# Patient Record
Sex: Female | Born: 1940
Health system: Southern US, Community
[De-identification: ages and names within clinical notes are randomized; demographics above are authoritative.]

## PROBLEM LIST (undated history)

## (undated) DIAGNOSIS — K219 Gastro-esophageal reflux disease without esophagitis: Secondary | ICD-10-CM

## (undated) DIAGNOSIS — F329 Major depressive disorder, single episode, unspecified: Secondary | ICD-10-CM

## (undated) DIAGNOSIS — M1990 Unspecified osteoarthritis, unspecified site: Secondary | ICD-10-CM

## (undated) DIAGNOSIS — C4490 Unspecified malignant neoplasm of skin, unspecified: Secondary | ICD-10-CM

## (undated) DIAGNOSIS — J45909 Unspecified asthma, uncomplicated: Secondary | ICD-10-CM

## (undated) DIAGNOSIS — C50919 Malignant neoplasm of unspecified site of unspecified female breast: Secondary | ICD-10-CM

## (undated) DIAGNOSIS — Z923 Personal history of irradiation: Secondary | ICD-10-CM

## (undated) DIAGNOSIS — F32A Depression, unspecified: Secondary | ICD-10-CM

## (undated) DIAGNOSIS — M5432 Sciatica, left side: Secondary | ICD-10-CM

## (undated) DIAGNOSIS — T8859XA Other complications of anesthesia, initial encounter: Secondary | ICD-10-CM

## (undated) DIAGNOSIS — I10 Essential (primary) hypertension: Secondary | ICD-10-CM

## (undated) DIAGNOSIS — E039 Hypothyroidism, unspecified: Secondary | ICD-10-CM

## (undated) HISTORY — PX: KNEE ARTHROSCOPY: SUR90

## (undated) HISTORY — PX: THYROID SURGERY: SHX805

## (undated) HISTORY — PX: ABDOMINAL HYSTERECTOMY: SHX81

## (undated) HISTORY — PX: LIPOSUCTION: SHX10

## (undated) HISTORY — PX: ESOPHAGOGASTRODUODENOSCOPY: SHX1529

## (undated) HISTORY — PX: DG THUMB LEFT HAND: HXRAD1658

## (undated) HISTORY — PX: COLONOSCOPY W/ POLYPECTOMY: SHX1380

## (undated) HISTORY — PX: ABDOMINOPLASTY: SUR9

## (undated) HISTORY — PX: CHOLECYSTECTOMY: SHX55

## (undated) HISTORY — PX: DG THUMB RIGHT HAND (ARMC HX): HXRAD1825

## (undated) HISTORY — PX: ESOPHAGEAL DILATION: SHX303

---

## 1993-01-17 HISTORY — PX: MASTECTOMY: SHX3

## 1993-01-17 HISTORY — PX: BREAST LUMPECTOMY: SHX2

## 1993-06-17 DIAGNOSIS — C50919 Malignant neoplasm of unspecified site of unspecified female breast: Secondary | ICD-10-CM

## 1993-06-17 HISTORY — DX: Malignant neoplasm of unspecified site of unspecified female breast: C50.919

## 2003-11-24 ENCOUNTER — Ambulatory Visit: Payer: Self-pay | Admitting: Internal Medicine

## 2004-12-20 ENCOUNTER — Ambulatory Visit: Payer: Self-pay | Admitting: Internal Medicine

## 2005-01-18 ENCOUNTER — Ambulatory Visit: Payer: Self-pay | Admitting: Gastroenterology

## 2006-01-16 ENCOUNTER — Ambulatory Visit: Payer: Self-pay | Admitting: Internal Medicine

## 2007-04-09 ENCOUNTER — Ambulatory Visit: Payer: Self-pay | Admitting: Internal Medicine

## 2008-04-09 ENCOUNTER — Ambulatory Visit: Payer: Self-pay | Admitting: Internal Medicine

## 2008-07-09 ENCOUNTER — Ambulatory Visit: Payer: Self-pay | Admitting: Gastroenterology

## 2009-01-12 ENCOUNTER — Ambulatory Visit: Payer: Self-pay | Admitting: Unknown Physician Specialty

## 2009-02-04 ENCOUNTER — Ambulatory Visit: Payer: Self-pay | Admitting: Unknown Physician Specialty

## 2009-04-13 ENCOUNTER — Ambulatory Visit: Payer: Self-pay | Admitting: Internal Medicine

## 2010-04-28 ENCOUNTER — Ambulatory Visit: Payer: Self-pay | Admitting: Internal Medicine

## 2011-05-30 ENCOUNTER — Ambulatory Visit: Payer: Self-pay | Admitting: Family Medicine

## 2012-05-31 ENCOUNTER — Ambulatory Visit: Payer: Self-pay | Admitting: Family Medicine

## 2012-11-26 ENCOUNTER — Ambulatory Visit: Payer: Self-pay | Admitting: Family Medicine

## 2013-01-04 ENCOUNTER — Ambulatory Visit: Payer: Self-pay | Admitting: Family Medicine

## 2013-07-17 ENCOUNTER — Ambulatory Visit: Payer: Self-pay | Admitting: Family Medicine

## 2014-03-12 ENCOUNTER — Ambulatory Visit: Payer: Self-pay | Admitting: Gastroenterology

## 2015-03-24 ENCOUNTER — Other Ambulatory Visit: Payer: Self-pay | Admitting: Family Medicine

## 2015-03-24 DIAGNOSIS — Z1231 Encounter for screening mammogram for malignant neoplasm of breast: Secondary | ICD-10-CM

## 2015-04-01 ENCOUNTER — Ambulatory Visit
Admission: RE | Admit: 2015-04-01 | Discharge: 2015-04-01 | Disposition: A | Discharge status: Home / Self Care | Payer: Medicare Other | Source: Ambulatory Visit | Attending: Family Medicine | Admitting: Family Medicine

## 2015-04-01 DIAGNOSIS — Z1231 Encounter for screening mammogram for malignant neoplasm of breast: Secondary | ICD-10-CM | POA: Insufficient documentation

## 2015-04-01 HISTORY — DX: Malignant neoplasm of unspecified site of unspecified female breast: C50.919

## 2015-04-01 HISTORY — DX: Unspecified malignant neoplasm of skin, unspecified: C44.90

## 2015-04-06 ENCOUNTER — Other Ambulatory Visit: Payer: Self-pay | Admitting: Family Medicine

## 2015-04-06 DIAGNOSIS — N63 Unspecified lump in unspecified breast: Secondary | ICD-10-CM

## 2015-04-07 ENCOUNTER — Ambulatory Visit
Admission: RE | Admit: 2015-04-07 | Discharge: 2015-04-07 | Disposition: A | Discharge status: Home / Self Care | Payer: Medicare Other | Source: Ambulatory Visit | Attending: Family Medicine | Admitting: Family Medicine

## 2015-04-07 DIAGNOSIS — N63 Unspecified lump in unspecified breast: Secondary | ICD-10-CM

## 2015-04-07 DIAGNOSIS — Z853 Personal history of malignant neoplasm of breast: Secondary | ICD-10-CM | POA: Diagnosis not present

## 2015-04-08 ENCOUNTER — Other Ambulatory Visit: Payer: Self-pay | Admitting: Family Medicine

## 2015-04-08 DIAGNOSIS — N6320 Unspecified lump in the left breast, unspecified quadrant: Secondary | ICD-10-CM

## 2015-04-13 ENCOUNTER — Ambulatory Visit
Admission: RE | Admit: 2015-04-13 | Discharge: 2015-04-13 | Disposition: A | Discharge status: Home / Self Care | Payer: Medicare Other | Source: Ambulatory Visit | Attending: Family Medicine | Admitting: Family Medicine

## 2015-04-13 DIAGNOSIS — N6320 Unspecified lump in the left breast, unspecified quadrant: Secondary | ICD-10-CM

## 2015-04-13 DIAGNOSIS — N6489 Other specified disorders of breast: Secondary | ICD-10-CM | POA: Insufficient documentation

## 2015-04-13 DIAGNOSIS — Z9889 Other specified postprocedural states: Secondary | ICD-10-CM | POA: Diagnosis not present

## 2015-04-13 DIAGNOSIS — N63 Unspecified lump in breast: Secondary | ICD-10-CM | POA: Diagnosis present

## 2015-04-13 HISTORY — PX: BREAST BIOPSY: SHX20

## 2015-04-15 LAB — SURGICAL PATHOLOGY

## 2015-04-28 ENCOUNTER — Other Ambulatory Visit: Payer: Self-pay | Admitting: Surgery

## 2015-04-28 DIAGNOSIS — N63 Unspecified lump in unspecified breast: Secondary | ICD-10-CM

## 2015-04-30 ENCOUNTER — Encounter
Admission: RE | Admit: 2015-04-30 | Discharge: 2015-04-30 | Disposition: A | Discharge status: Home / Self Care | Payer: Medicare Other | Source: Ambulatory Visit | Attending: Surgery | Admitting: Surgery

## 2015-04-30 DIAGNOSIS — Z01810 Encounter for preprocedural cardiovascular examination: Secondary | ICD-10-CM | POA: Diagnosis not present

## 2015-04-30 DIAGNOSIS — I10 Essential (primary) hypertension: Secondary | ICD-10-CM

## 2015-04-30 HISTORY — DX: Major depressive disorder, single episode, unspecified: F32.9

## 2015-04-30 HISTORY — DX: Gastro-esophageal reflux disease without esophagitis: K21.9

## 2015-04-30 HISTORY — DX: Essential (primary) hypertension: I10

## 2015-04-30 HISTORY — DX: Unspecified asthma, uncomplicated: J45.909

## 2015-04-30 HISTORY — DX: Hypothyroidism, unspecified: E03.9

## 2015-04-30 HISTORY — DX: Unspecified osteoarthritis, unspecified site: M19.90

## 2015-04-30 HISTORY — DX: Depression, unspecified: F32.A

## 2015-04-30 NOTE — Pre-Procedure Instructions (Signed)
EKG reveals no change from EKG taken in 2011.  No cardiac symptoms present.

## 2015-04-30 NOTE — Patient Instructions (Signed)
  Your procedure is scheduled on: 05/05/15 Report to Day Surgery. Arrive at [facility] at 10:00.  Remember: Instructions that are not followed completely may result in serious medical risk, up to and including death, or upon the discretion of your surgeon and anesthesiologist your surgery may need to be rescheduled.    __x__ 1. Do not eat food or drink liquids after midnight. No gum chewing or hard candies.     __x__ 2. No Alcohol for 24 hours before or after surgery.   ____ 3. Bring all medications with you on the day of surgery if instructed.    __x__ 4. Notify your doctor if there is any change in your medical condition     (cold, fever, infections).     Do not wear jewelry, make-up, hairpins, clips or nail polish.  Do not wear lotions, powders, or perfumes. You may wear deodorant.  Do not shave 48 hours prior to surgery. Men may shave face and neck.  Do not bring valuables to the hospital.    [facility] is not responsible for any belongings or valuables.               Contacts, dentures or bridgework may not be worn into surgery.  Leave your suitcase in the car. After surgery it may be brought to your room.  For patients admitted to the hospital, discharge time is determined by your                treatment team.   Patients discharged the day of surgery will not be allowed to drive home.   Please read over the following fact sheets that you were given:   Surgical Site Infection Prevention   ____ Take these medicines the morning of surgery with A SIP OF WATER:    1. Omeprazole the night before surgery and morning of surgery  2. levothroxine  3. claritin  4. effexor  5.  6.  ____ Fleet Enema (as directed)   __x__ Use CHG Soap as directed  __x__ Use inhalers on the day of surgery and bring it with you  ____ Stop metformin 2 days prior to surgery    ____ Take 1/2 of usual insulin dose the night before surgery and none on the morning of surgery.   ____ Stop  Coumadin/Plavix/aspirin   _x___ Stop Anti-inflammatories on Saturday per MD instruction   _x___ Stop supplements until after surgery.  Glucosamine today  ____ Bring C-Pap to the hospital.

## 2015-05-05 ENCOUNTER — Ambulatory Visit: Payer: Medicare Other | Admitting: Anesthesiology

## 2015-05-05 ENCOUNTER — Encounter: Payer: Self-pay | Admitting: *Deleted

## 2015-05-05 ENCOUNTER — Ambulatory Visit
Admission: RE | Admit: 2015-05-05 | Discharge: 2015-05-05 | Disposition: A | Discharge status: Home / Self Care | Payer: Medicare Other | Source: Ambulatory Visit | Attending: Surgery | Admitting: Surgery

## 2015-05-05 ENCOUNTER — Encounter
Admission: RE | Disposition: A | Discharge status: Home / Self Care | Payer: Self-pay | Source: Ambulatory Visit | Attending: Surgery

## 2015-05-05 DIAGNOSIS — Z823 Family history of stroke: Secondary | ICD-10-CM | POA: Diagnosis not present

## 2015-05-05 DIAGNOSIS — Z825 Family history of asthma and other chronic lower respiratory diseases: Secondary | ICD-10-CM | POA: Diagnosis not present

## 2015-05-05 DIAGNOSIS — G43909 Migraine, unspecified, not intractable, without status migrainosus: Secondary | ICD-10-CM | POA: Diagnosis not present

## 2015-05-05 DIAGNOSIS — Z9049 Acquired absence of other specified parts of digestive tract: Secondary | ICD-10-CM | POA: Insufficient documentation

## 2015-05-05 DIAGNOSIS — K5790 Diverticulosis of intestine, part unspecified, without perforation or abscess without bleeding: Secondary | ICD-10-CM | POA: Diagnosis not present

## 2015-05-05 DIAGNOSIS — Z8249 Family history of ischemic heart disease and other diseases of the circulatory system: Secondary | ICD-10-CM | POA: Diagnosis not present

## 2015-05-05 DIAGNOSIS — D242 Benign neoplasm of left breast: Secondary | ICD-10-CM | POA: Insufficient documentation

## 2015-05-05 DIAGNOSIS — Z79899 Other long term (current) drug therapy: Secondary | ICD-10-CM | POA: Diagnosis not present

## 2015-05-05 DIAGNOSIS — Z90710 Acquired absence of both cervix and uterus: Secondary | ICD-10-CM | POA: Insufficient documentation

## 2015-05-05 DIAGNOSIS — F329 Major depressive disorder, single episode, unspecified: Secondary | ICD-10-CM | POA: Insufficient documentation

## 2015-05-05 DIAGNOSIS — N63 Unspecified lump in unspecified breast: Secondary | ICD-10-CM

## 2015-05-05 DIAGNOSIS — F410 Panic disorder [episodic paroxysmal anxiety] without agoraphobia: Secondary | ICD-10-CM | POA: Insufficient documentation

## 2015-05-05 DIAGNOSIS — Z881 Allergy status to other antibiotic agents status: Secondary | ICD-10-CM | POA: Insufficient documentation

## 2015-05-05 DIAGNOSIS — N6092 Unspecified benign mammary dysplasia of left breast: Secondary | ICD-10-CM | POA: Insufficient documentation

## 2015-05-05 DIAGNOSIS — Z7951 Long term (current) use of inhaled steroids: Secondary | ICD-10-CM | POA: Diagnosis not present

## 2015-05-05 DIAGNOSIS — K219 Gastro-esophageal reflux disease without esophagitis: Secondary | ICD-10-CM | POA: Insufficient documentation

## 2015-05-05 DIAGNOSIS — M8580 Other specified disorders of bone density and structure, unspecified site: Secondary | ICD-10-CM | POA: Diagnosis not present

## 2015-05-05 DIAGNOSIS — Z86010 Personal history of colonic polyps: Secondary | ICD-10-CM | POA: Insufficient documentation

## 2015-05-05 DIAGNOSIS — Z880 Allergy status to penicillin: Secondary | ICD-10-CM | POA: Diagnosis not present

## 2015-05-05 DIAGNOSIS — Z800 Family history of malignant neoplasm of digestive organs: Secondary | ICD-10-CM | POA: Diagnosis not present

## 2015-05-05 DIAGNOSIS — Z888 Allergy status to other drugs, medicaments and biological substances status: Secondary | ICD-10-CM | POA: Insufficient documentation

## 2015-05-05 DIAGNOSIS — E559 Vitamin D deficiency, unspecified: Secondary | ICD-10-CM | POA: Insufficient documentation

## 2015-05-05 DIAGNOSIS — Z853 Personal history of malignant neoplasm of breast: Secondary | ICD-10-CM | POA: Diagnosis not present

## 2015-05-05 DIAGNOSIS — K5900 Constipation, unspecified: Secondary | ICD-10-CM | POA: Diagnosis not present

## 2015-05-05 DIAGNOSIS — I10 Essential (primary) hypertension: Secondary | ICD-10-CM | POA: Diagnosis not present

## 2015-05-05 DIAGNOSIS — E039 Hypothyroidism, unspecified: Secondary | ICD-10-CM | POA: Diagnosis not present

## 2015-05-05 HISTORY — PX: BREAST BIOPSY: SHX20

## 2015-05-05 HISTORY — PX: BREAST LUMPECTOMY WITH NEEDLE LOCALIZATION: SHX5759

## 2015-05-05 SURGERY — BREAST LUMPECTOMY WITH NEEDLE LOCALIZATION
Anesthesia: General | Laterality: Left | Wound class: Clean

## 2015-05-05 MED ORDER — FENTANYL CITRATE (PF) 100 MCG/2ML IJ SOLN
INTRAMUSCULAR | Status: DC | PRN
  Administered 2015-05-05 (×2): 50 ug via INTRAVENOUS

## 2015-05-05 MED ORDER — SCOPOLAMINE 1 MG/3DAYS TD PT72
MEDICATED_PATCH | TRANSDERMAL | Status: DC
Start: 2015-05-05 — End: 2015-05-06
  Filled 2015-05-05: qty 1

## 2015-05-05 MED ORDER — BUPIVACAINE-EPINEPHRINE (PF) 0.5% -1:200000 IJ SOLN
INTRAMUSCULAR | Status: AC
Start: 2015-05-05 — End: 2015-05-05
  Filled 2015-05-05: qty 30

## 2015-05-05 MED ORDER — LACTATED RINGERS IV SOLN
INTRAVENOUS | Status: DC
  Administered 2015-05-05 (×2): via INTRAVENOUS

## 2015-05-05 MED ORDER — ONDANSETRON HCL 4 MG/2ML IJ SOLN: 4.0000 mg | Freq: Once | INTRAMUSCULAR | Status: DC | PRN

## 2015-05-05 MED ORDER — ROCURONIUM BROMIDE 100 MG/10ML IV SOLN
INTRAVENOUS | Status: DC | PRN
  Administered 2015-05-05: 40 mg via INTRAVENOUS

## 2015-05-05 MED ORDER — HYDROCODONE-ACETAMINOPHEN 5-325 MG PO TABS: 1.0000 | ORAL_TABLET | ORAL | Status: DC | PRN

## 2015-05-05 MED ORDER — SUGAMMADEX SODIUM 200 MG/2ML IV SOLN
INTRAVENOUS | Status: DC | PRN
  Administered 2015-05-05: 153.4 mg via INTRAVENOUS

## 2015-05-05 MED ORDER — MIDAZOLAM HCL 2 MG/2ML IJ SOLN
INTRAMUSCULAR | Status: DC | PRN
  Administered 2015-05-05 (×2): 1 mg via INTRAVENOUS

## 2015-05-05 MED ORDER — FENTANYL CITRATE (PF) 100 MCG/2ML IJ SOLN: 25.0000 ug | INTRAMUSCULAR | Status: DC | PRN

## 2015-05-05 MED ORDER — PROPOFOL 10 MG/ML IV BOLUS
INTRAVENOUS | Status: DC | PRN
Start: 2015-05-05 — End: 2015-05-05
  Administered 2015-05-05: 150 mg via INTRAVENOUS

## 2015-05-05 MED ORDER — BUPIVACAINE-EPINEPHRINE (PF) 0.5% -1:200000 IJ SOLN
INTRAMUSCULAR | Status: DC | PRN
  Administered 2015-05-05: 10 mL

## 2015-05-05 MED ORDER — ONDANSETRON HCL 4 MG/2ML IJ SOLN
INTRAMUSCULAR | Status: DC | PRN
  Administered 2015-05-05: 4 mg via INTRAVENOUS

## 2015-05-05 MED ORDER — LIDOCAINE HCL (CARDIAC) 20 MG/ML IV SOLN
INTRAVENOUS | Status: DC | PRN
Start: 2015-05-05 — End: 2015-05-05
  Administered 2015-05-05: 30 mg via INTRAVENOUS

## 2015-05-05 MED ORDER — SCOPOLAMINE 1 MG/3DAYS TD PT72
1.0000 | MEDICATED_PATCH | Freq: Once | TRANSDERMAL | Status: DC
  Administered 2015-05-05: 1.5 mg via TRANSDERMAL

## 2015-05-05 SURGICAL SUPPLY — 29 items
BLADE SURG 15 STRL LF DISP TIS (BLADE) ×1 IMPLANT
BLADE SURG 15 STRL SS (BLADE) ×2
CANISTER SUCT 1200ML W/VALVE (MISCELLANEOUS) ×3 IMPLANT
CHLORAPREP W/TINT 26ML (MISCELLANEOUS) ×3 IMPLANT
CNTNR SPEC 2.5X3XGRAD LEK (MISCELLANEOUS)
CONT SPEC 4OZ STER OR WHT (MISCELLANEOUS)
CONTAINER SPEC 2.5X3XGRAD LEK (MISCELLANEOUS) IMPLANT
DEVICE DUBIN SPECIMEN MAMMOGRA (MISCELLANEOUS) ×3 IMPLANT
DRAPE LAPAROTOMY TRNSV 106X77 (MISCELLANEOUS) ×3 IMPLANT
ELECT REM PT RETURN 9FT ADLT (ELECTROSURGICAL) ×3
ELECTRODE REM PT RTRN 9FT ADLT (ELECTROSURGICAL) ×1 IMPLANT
GLOVE BIO SURGEON STRL SZ7.5 (GLOVE) ×3 IMPLANT
GOWN STRL REUS W/ TWL LRG LVL3 (GOWN DISPOSABLE) ×4 IMPLANT
GOWN STRL REUS W/TWL LRG LVL3 (GOWN DISPOSABLE) ×8
KIT RM TURNOVER STRD PROC AR (KITS) ×3 IMPLANT
LABEL OR SOLS (LABEL) ×3 IMPLANT
LIQUID BAND (GAUZE/BANDAGES/DRESSINGS) ×3 IMPLANT
MARGIN MAP 10MM (MISCELLANEOUS) ×3 IMPLANT
NEEDLE HYPO 25X1 1.5 SAFETY (NEEDLE) ×3 IMPLANT
PACK BASIN MINOR ARMC (MISCELLANEOUS) ×3 IMPLANT
SLEVE PROBE SENORX GAMMA FIND (MISCELLANEOUS) IMPLANT
SPONGE LAP 18X18 5 PK (GAUZE/BANDAGES/DRESSINGS) ×3 IMPLANT
SUT CHROMIC 4 0 RB 1X27 (SUTURE) ×3 IMPLANT
SUT CHROMIC 4 0 SH 27 (SUTURE) ×3 IMPLANT
SUT ETHILON 3-0 FS-10 30 BLK (SUTURE) ×3
SUT MNCRL AB 4-0 PS2 18 (SUTURE) IMPLANT
SUTURE EHLN 3-0 FS-10 30 BLK (SUTURE) ×1 IMPLANT
SYRINGE 10CC LL (SYRINGE) ×3 IMPLANT
WATER STERILE IRR 1000ML POUR (IV SOLUTION) ×3 IMPLANT

## 2015-05-05 NOTE — H&P (Signed)
  She reports no change in condition since the day of the office exam. I discussed the plan for excision of left breast mass. X-rays have been reviewed. The left side was marked YES

## 2015-05-05 NOTE — Discharge Instructions (Addendum)
Take Tylenol or Norco if needed for pain.   May shower.   Wear bra as desired for support and comfort.      AMBULATORY SURGERY  DISCHARGE INSTRUCTIONS   1) The drugs that you were given will stay in your system until tomorrow so for the next 24 hours you should not:  A) Drive an automobile B) Make any legal decisions C) Drink any alcoholic beverage   2) You may resume regular meals tomorrow.  Today it is better to start with liquids and gradually work up to solid foods.  You may eat anything you prefer, but it is better to start with liquids, then soup and crackers, and gradually work up to solid foods.   3) Please notify your doctor immediately if you have any unusual bleeding, trouble breathing, redness and pain at the surgery site, drainage, fever, or pain not relieved by medication.    4) Additional Instructions:        Please contact your physician with any problems or Same Day Surgery at [phone], Monday through Friday 6 am to 4 pm, or [place] [facility] number at [phone].

## 2015-05-05 NOTE — Op Note (Signed)
OPERATIVE REPORT  PREOPERATIVE  DIAGNOSIS: . Left breast mass  POSTOPERATIVE DIAGNOSIS: . Left breast mass  PROCEDURE: . Excision left breast mass  ANESTHESIA:  General  SURGEON: Rochel Brome  MD   INDICATIONS: . She had recent mammogram depicting a small 7 mm nodule in the upper outer quadrant of the left breast. This was also demonstrated on ultrasound. Ultrasound-guided needle biopsy demonstrated intraductal papilloma with atypia. 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 Excision of this mass was recommended for further treatment and evaluation.  With the patient on the operating table in the supine position under general anesthesia the dressing was removed from the left breast exposing the Kopan's wire which entered the breast at the 1:30 position some 8 cm from the nipple. The wire was cut 2 cm from the skin. The left arm was placed on a lateral arm support. The breast was prepared with ChloraPrep and draped in a sterile manner.  An obliquely oriented curvilinear incision was made from approximately 1:00 position to the 2:00 position carried down through subcutaneous tissues and encountered the Kopan's wire. The biopsy site was identified. A sample of tissue approximately 2 cm in diameter was excised. Specimen mammogram demonstrated the presence of the biopsy marker. The specimen was submitted for routine pathology  The wound was inspected and could see hemostasis was intact. The subcutaneous tissues were infiltrated with half percent Sensorcaine with epinephrine. The wound was closed with a running 4-0 Monocryl subcuticular suture and LiquiBand.  The patient tolerated the procedure satisfactorily and appeared to be in satisfactory condition for transfer to the recovery room  Ravanna.D.

## 2015-05-05 NOTE — Anesthesia Procedure Notes (Signed)
Procedure Name: Intubation Date/Time: 05/05/2015 2:20 PM Performed by: Courtney Paris Pre-anesthesia Checklist: Patient identified, Patient being monitored, Timeout performed, Emergency Drugs available and Suction available Patient Re-evaluated:Patient Re-evaluated prior to inductionOxygen Delivery Method: Circle system utilized Preoxygenation: Pre-oxygenation with 100% oxygen Intubation Type: IV induction Ventilation: Mask ventilation without difficulty Laryngoscope Size: 3 and Miller Grade View: Grade I Tube type: Oral Tube size: 7.0 mm Number of attempts: 1 Airway Equipment and Method: Stylet Placement Confirmation: ETT inserted through vocal cords under direct vision,  positive ETCO2 and breath sounds checked- equal and bilateral Secured at: 22 cm Tube secured with: Tape Dental Injury: Teeth and Oropharynx as per pre-operative assessment

## 2015-05-05 NOTE — Transfer of Care (Signed)
Immediate Anesthesia Transfer of Care Note  Patient: Kristine Woods  Procedure(s) Performed: Procedure(s): BREAST LUMPECTOMY WITH NEEDLE LOCALIZATION (Left)  Patient Location: PACU  Anesthesia Type:General  Level of Consciousness: patient cooperative and lethargic  Airway & Oxygen Therapy: Patient Spontanous Breathing and Patient connected to face mask oxygen  Post-op Assessment: Report given to RN and Post -op Vital signs reviewed and stable  Post vital signs: Reviewed and stable  Last Vitals:  Filed Vitals:   05/05/15 1100 05/05/15 1526  BP: 134/93 144/89  Pulse: 93 109  Temp: 36.1 C 36.3 C  Resp: 16 15    Complications: No apparent anesthesia complications

## 2015-05-05 NOTE — Anesthesia Preprocedure Evaluation (Signed)
Anesthesia Evaluation  Patient identified by MRN, date of birth, ID band Patient awake    Reviewed: Allergy & Precautions, H&P , NPO status , Patient's Chart, lab work & pertinent test results, reviewed documented beta blocker date and time   History of Anesthesia Complications (+) PONV and history of anesthetic complications  Airway Mallampati: II  TM Distance: >3 FB Neck ROM: full    Dental  (+) Teeth Intact Permanent bridge:   Pulmonary neg shortness of breath, asthma , neg sleep apnea, neg COPD, neg recent URI,    Pulmonary exam normal breath sounds clear to auscultation       Cardiovascular Exercise Tolerance: Good hypertension, (-) angina(-) CAD, (-) Past MI, (-) Cardiac Stents and (-) CABG Normal cardiovascular exam(-) dysrhythmias (-) Valvular Problems/Murmurs Rhythm:regular Rate:Normal     Neuro/Psych PSYCHIATRIC DISORDERS (Depression) negative neurological ROS     GI/Hepatic Neg liver ROS, GERD  Medicated,  Endo/Other  neg diabetesHypothyroidism   Renal/GU negative Renal ROS  negative genitourinary   Musculoskeletal   Abdominal   Peds  Hematology negative hematology ROS (+)   Anesthesia Other Findings Past Medical History:   Breast cancer ([place])                             06/17/93         Comment:lt lumpectomy/rad   Skin cancer                                                  Hypothyroidism                                               Hypertension                                                 Asthma                                                       Depression                                                   GERD (gastroesophageal reflux disease)                       Arthritis                                                    Reproductive/Obstetrics negative OB ROS  Anesthesia Physical Anesthesia Plan  ASA: III  Anesthesia Plan: General    Post-op Pain Management:    Induction:   Airway Management Planned:   Additional Equipment:   Intra-op Plan:   Post-operative Plan:   Informed Consent: I have reviewed the patients History and Physical, chart, labs and discussed the procedure including the risks, benefits and alternatives for the proposed anesthesia with the patient or authorized representative who has indicated his/her understanding and acceptance.   Dental Advisory Given  Plan Discussed with: Anesthesiologist, CRNA and Surgeon  Anesthesia Plan Comments:         Anesthesia Quick Evaluation

## 2015-05-06 ENCOUNTER — Encounter: Payer: Self-pay | Admitting: Surgery

## 2015-05-06 LAB — SURGICAL PATHOLOGY

## 2015-05-06 NOTE — Anesthesia Postprocedure Evaluation (Signed)
Anesthesia Post Note  Patient: Kristine Woods  Procedure(s) Performed: Procedure(s) (LRB): BREAST LUMPECTOMY WITH NEEDLE LOCALIZATION (Left)  Patient location during evaluation: PACU Anesthesia Type: General Level of consciousness: awake and alert Pain management: pain level controlled Vital Signs Assessment: post-procedure vital signs reviewed and stable Respiratory status: spontaneous breathing, nonlabored ventilation, respiratory function stable and patient connected to nasal cannula oxygen Cardiovascular status: blood pressure returned to baseline and stable Postop Assessment: no signs of nausea or vomiting Anesthetic complications: no    Last Vitals:  Filed Vitals:   05/05/15 1623 05/05/15 1740  BP: 135/74 136/74  Pulse: 94 96  Temp: 37 C 36.8 C  Resp: 14 16    Last Pain:  Filed Vitals:   05/05/15 1745  PainSc: 0-No pain                 Martha Clan

## 2015-05-07 ENCOUNTER — Encounter: Payer: Self-pay | Admitting: Surgery

## 2015-10-28 ENCOUNTER — Ambulatory Visit (INDEPENDENT_AMBULATORY_CARE_PROVIDER_SITE_OTHER): Payer: Medicare Other

## 2015-10-28 ENCOUNTER — Ambulatory Visit
Admission: EM | Admit: 2015-10-28 | Discharge: 2015-10-28 | Disposition: A | Discharge status: Home / Self Care | Payer: Medicare Other | Attending: Family Medicine | Admitting: Family Medicine

## 2015-10-28 DIAGNOSIS — M79605 Pain in left leg: Secondary | ICD-10-CM

## 2015-10-28 DIAGNOSIS — M5432 Sciatica, left side: Secondary | ICD-10-CM

## 2015-10-28 MED ORDER — PREDNISONE 20 MG PO TABS: 20.0000 mg | ORAL_TABLET | Freq: Every day | ORAL | 0 refills | Status: DC

## 2015-10-28 MED ORDER — OXYCODONE-ACETAMINOPHEN 5-325 MG PO TABS: 1.0000 | ORAL_TABLET | Freq: Three times a day (TID) | ORAL | 0 refills | Status: DC | PRN

## 2015-10-28 NOTE — ED Triage Notes (Signed)
Patient c/o left leg pain since Monday. She says she has a bad knee on that side and she thought that may be the problem. She rested it yesterday however today there is pain that is radiating into her buttocks.

## 2015-10-28 NOTE — ED Provider Notes (Signed)
[facility]    CSN: [id number] Arrival date & time: 10/28/15  1047     History   Chief Complaint Chief Complaint  Patient presents with  . Leg Pain    Left    HPI Kristine Woods is a 75 y.o. female.   The history is provided by the patient.  Leg Pain  Location:  Leg Injury: no   Leg location:  L lower leg and L leg Pain details:    Quality:  Aching   Radiates to:  Does not radiate   Severity:  Severe   Onset quality:  Sudden   Duration:  3 days   Timing:  Constant   Progression:  Unchanged Chronicity:  New Dislocation: no   Foreign body present:  No foreign bodies Prior injury to area:  No Relieved by: pain medication. Associated symptoms: back pain (also started with left sided low back and buttock pain yesterday)   Associated symptoms: no fever     Past Medical History:  Diagnosis Date  . Arthritis   . Asthma   . Breast cancer ([place]) 06/17/93   lt lumpectomy/rad  . Depression   . GERD (gastroesophageal reflux disease)   . Hypertension   . Hypothyroidism   . Skin cancer     There are no active problems to display for this patient.   Past Surgical History:  Procedure Laterality Date  . ABDOMINAL HYSTERECTOMY    . ABDOMINOPLASTY    . BREAST BIOPSY Left 04/13/2015   intraductal papilloma  . BREAST LUMPECTOMY WITH NEEDLE LOCALIZATION Left 05/05/2015   Procedure: BREAST LUMPECTOMY WITH NEEDLE LOCALIZATION;  Surgeon: Leonie Green, MD;  Location: [facility] ORS;  Service: General;  Laterality: Left;  . CHOLECYSTECTOMY    . COLONOSCOPY W/ POLYPECTOMY    . DG THUMB LEFT HAND    . DG THUMB RIGHT HAND ([place] HX)    . ESOPHAGEAL DILATION    . ESOPHAGOGASTRODUODENOSCOPY    . KNEE ARTHROSCOPY Left   . LIPOSUCTION    . MASTECTOMY Left    partial   . THYROID SURGERY     adenoma    OB History    No data available       Home Medications    Prior to Admission medications   Medication Sig Start Date End Date Taking? Authorizing Provider    albuterol (PROVENTIL HFA;VENTOLIN HFA) 108 (90 Base) MCG/ACT inhaler Inhale 2 puffs into the lungs every 4 (four) hours as needed for wheezing or shortness of breath.   Yes Historical Provider, MD  calcium-vitamin D (OSCAL WITH D) 500-200 MG-UNIT tablet Take 2 tablets by mouth daily with breakfast. Calcium carbonate 600 Vitamin D3 400   Yes Historical Provider, MD  enalapril-hydrochlorothiazide (VASERETIC) 10-25 MG tablet Take 1 tablet by mouth every morning.   Yes Historical Provider, MD  FIBER PO Take 2 tablets by mouth daily.   Yes Historical Provider, MD  Glucosamine Sulfate 1000 MG CAPS Take 1 capsule by mouth every morning.   Yes Historical Provider, MD  ibuprofen (ADVIL,MOTRIN) 200 MG tablet Take 400 mg by mouth 2 (two) times daily.   Yes Historical Provider, MD  levothyroxine (SYNTHROID, LEVOTHROID) 75 MCG tablet Take 75 mcg by mouth daily before breakfast.   Yes Historical Provider, MD  loratadine (CLARITIN) 10 MG tablet Take 10 mg by mouth daily.   Yes Historical Provider, MD  Multiple Vitamin (MULTIVITAMIN) tablet Take 1 tablet by mouth daily.   Yes Historical Provider, MD  omeprazole ([place])  20 MG capsule Take 20 mg by mouth daily.   Yes Historical Provider, MD  venlafaxine (EFFEXOR) 75 MG tablet Take 75 mg by mouth 2 (two) times daily.   Yes Historical Provider, MD  HYDROcodone-acetaminophen (NORCO) 5-325 MG tablet Take 1-2 tablets by mouth every 4 (four) hours as needed for moderate pain. 05/05/15   Leonie Green, MD  oxyCODONE-acetaminophen (PERCOCET/ROXICET) 5-325 MG tablet Take 1-2 tablets by mouth every 8 (eight) hours as needed for severe pain. 10/28/15   Norval Gable, MD  predniSONE (DELTASONE) 20 MG tablet Take 1 tablet (20 mg total) by mouth daily with breakfast. 10/28/15   Norval Gable, MD    Family History History reviewed. No pertinent family history.  Social History Social History  Substance Use Topics  . Smoking status: Never Smoker  . Smokeless tobacco:  Never Used  . Alcohol use Yes     Comment: 2 glasses a week     Allergies   Erythromycin; Other; Penicillins; and Sulfa antibiotics   Review of Systems Review of Systems  Constitutional: Negative for fever.  Musculoskeletal: Positive for back pain (also started with left sided low back and buttock pain yesterday).     Physical Exam Triage Vital Signs ED Triage Vitals  Enc Vitals Group     BP 10/28/15 1104 (!) 143/87     Pulse Rate 10/28/15 1104 93     Resp 10/28/15 1104 18     Temp 10/28/15 1104 97.2 F (36.2 C)     Temp Source 10/28/15 1104 Tympanic     SpO2 10/28/15 1104 95 %     Weight 10/28/15 1103 176 lb (79.8 kg)     Height 10/28/15 1103 5\' 3"  (1.6 m)     Head Circumference --      Peak Flow --      Pain Score --      Pain Loc --      Pain Edu? --      Excl. in [place]? --    No data found.   Updated Vital Signs BP (!) 143/87 (BP Location: Right Arm)   Pulse 93   Temp 97.2 F (36.2 C) (Tympanic)   Resp 18   Ht 5\' 3"  (1.6 m)   Wt 176 lb (79.8 kg)   SpO2 95%   BMI 31.18 kg/m   Visual Acuity Right Eye Distance:   Left Eye Distance:   Bilateral Distance:    Right Eye Near:   Left Eye Near:    Bilateral Near:     Physical Exam  Constitutional: She is oriented to person, place, and time. She appears well-developed and well-nourished. No distress.  Musculoskeletal:       Lumbar back: She exhibits tenderness (over the lumbar paraspinous muscles on the left and over the left buttock). She exhibits normal range of motion, no bony tenderness, no swelling, no edema, no deformity, no laceration, no pain, no spasm and normal pulse.       Left lower leg: She exhibits bony tenderness (over tibia) and swelling (mild, over tibia). She exhibits no tenderness, no edema, no deformity and no laceration.  Neurological: She is alert and oriented to person, place, and time. She displays normal reflexes. No cranial nerve deficit. She exhibits normal muscle tone. Coordination  normal.  Skin: She is not diaphoretic. No erythema.  Nursing note and vitals reviewed.    UC Treatments / Results  Labs (all labs ordered are listed, but only abnormal results are displayed) Labs Reviewed -  No data to display  EKG  EKG Interpretation None       Radiology No results found.  Procedures Procedures (including critical care time)  Medications Ordered in UC Medications - No data to display   Initial Impression / Assessment and Plan / UC Course  I have reviewed the triage vital signs and the nursing notes.  Pertinent labs & imaging results that were available during my care of the patient were reviewed by me and considered in my medical decision making (see chart for details).  Clinical Course      Final Clinical Impressions(s) / UC Diagnoses   Final diagnoses:  Left leg pain  Sciatica of left side    New Prescriptions New Prescriptions   OXYCODONE-ACETAMINOPHEN (PERCOCET/ROXICET) 5-325 MG TABLET    Take 1-2 tablets by mouth every 8 (eight) hours as needed for severe pain.   PREDNISONE (DELTASONE) 20 MG TABLET    Take 1 tablet (20 mg total) by mouth daily with breakfast.   1. X-ray results and diagnoses reviewed with patient 2. rx as per orders above; reviewed possible side effects, interactions, risks and benefits  3. Recommend supportive treatment with rest, ice, stretching 4. Follow-up prn if symptoms worsen or don't improve   Norval Gable, MD 10/28/15 1215

## 2015-11-01 ENCOUNTER — Ambulatory Visit
Admission: RE | Admit: 2015-11-01 | Discharge: 2015-11-01 | Disposition: A | Discharge status: Home / Self Care | Payer: Medicare Other | Source: Ambulatory Visit | Attending: Family Medicine | Admitting: Family Medicine

## 2015-11-01 ENCOUNTER — Encounter: Payer: Self-pay | Admitting: *Deleted

## 2015-11-01 ENCOUNTER — Ambulatory Visit
Admission: AD | Admit: 2015-11-01 | Discharge: 2015-11-01 | Disposition: A | Discharge status: Home / Self Care | Payer: Medicare Other | Source: Ambulatory Visit | Attending: Family Medicine | Admitting: Family Medicine

## 2015-11-01 ENCOUNTER — Ambulatory Visit
Admission: EM | Admit: 2015-11-01 | Discharge: 2015-11-01 | Disposition: A | Discharge status: Home / Self Care | Payer: Medicare Other | Attending: Family Medicine | Admitting: Family Medicine

## 2015-11-01 DIAGNOSIS — M5432 Sciatica, left side: Secondary | ICD-10-CM

## 2015-11-01 DIAGNOSIS — M79605 Pain in left leg: Secondary | ICD-10-CM

## 2015-11-01 DIAGNOSIS — R938 Abnormal findings on diagnostic imaging of other specified body structures: Secondary | ICD-10-CM | POA: Insufficient documentation

## 2015-11-01 MED ORDER — OXYCODONE-ACETAMINOPHEN 5-325 MG PO TABS: 1.0000 | ORAL_TABLET | Freq: Three times a day (TID) | ORAL | 0 refills | Status: DC | PRN

## 2015-11-01 NOTE — ED Provider Notes (Signed)
[facility]    CSN: [id number] Arrival date & time: 11/01/15  0818     History   Chief Complaint Chief Complaint  Patient presents with  . Back Pain  . Leg Pain  . Foot Pain  . Ankle Pain    HPI Kristine Woods is a 75 y.o. female.   75 yo female seen here Wednesday for sciatica, here today with c/o continued low back pain along with left lower leg pain, ankle and foot pain. Denies injury. States left big toe feels numb. Denies any saddle anesthesia, fevers, chills, rash, bowel or urinary problems. Patient also complains of left calf pain. Denies recent travel, prolonged immobilization or recent surgery.         The history is provided by the patient.  Back Pain  Associated symptoms: leg pain   Leg Pain  Associated symptoms: back pain   Foot Pain   Ankle Pain  Associated symptoms: back pain     Past Medical History:  Diagnosis Date  . Arthritis   . Asthma   . Breast cancer ([place]) 06/17/93   lt lumpectomy/rad  . Depression   . GERD (gastroesophageal reflux disease)   . Hypertension   . Hypothyroidism   . Skin cancer     There are no active problems to display for this patient.   Past Surgical History:  Procedure Laterality Date  . ABDOMINAL HYSTERECTOMY    . ABDOMINOPLASTY    . BREAST BIOPSY Left 04/13/2015   intraductal papilloma  . BREAST LUMPECTOMY WITH NEEDLE LOCALIZATION Left 05/05/2015   Procedure: BREAST LUMPECTOMY WITH NEEDLE LOCALIZATION;  Surgeon: Leonie Green, MD;  Location: [facility] ORS;  Service: General;  Laterality: Left;  . CHOLECYSTECTOMY    . COLONOSCOPY W/ POLYPECTOMY    . DG THUMB LEFT HAND    . DG THUMB RIGHT HAND ([place] [facility])    . ESOPHAGEAL DILATION    . ESOPHAGOGASTRODUODENOSCOPY    . KNEE ARTHROSCOPY Left   . LIPOSUCTION    . MASTECTOMY Left    partial   . THYROID SURGERY     adenoma    OB History    No data available       Home Medications    Prior to Admission medications   Medication Sig Start  Date End Date Taking? Authorizing Provider  albuterol (PROVENTIL HFA;VENTOLIN HFA) 108 (90 Base) MCG/ACT inhaler Inhale 2 puffs into the lungs every 4 (four) hours as needed for wheezing or shortness of breath.   Yes Historical Provider, MD  calcium-vitamin D (OSCAL WITH D) 500-200 MG-UNIT tablet Take 2 tablets by mouth daily with breakfast. Calcium carbonate 600 Vitamin D3 400   Yes Historical Provider, MD  enalapril-hydrochlorothiazide (VASERETIC) 10-25 MG tablet Take 1 tablet by mouth every morning.   Yes Historical Provider, MD  FIBER PO Take 2 tablets by mouth daily.   Yes Historical Provider, MD  Glucosamine Sulfate 1000 MG CAPS Take 1 capsule by mouth every morning.   Yes Historical Provider, MD  HYDROcodone-acetaminophen (NORCO) 5-325 MG tablet Take 1-2 tablets by mouth every 4 (four) hours as needed for moderate pain. 05/05/15  Yes Leonie Green, MD  ibuprofen (ADVIL,MOTRIN) 200 MG tablet Take 400 mg by mouth 2 (two) times daily.   Yes Historical Provider, MD  levothyroxine (SYNTHROID, LEVOTHROID) 75 MCG tablet Take 75 mcg by mouth daily before breakfast.   Yes Historical Provider, MD  loratadine (CLARITIN) 10 MG tablet Take 10 mg by mouth daily.  Yes Historical Provider, MD  Multiple Vitamin (MULTIVITAMIN) tablet Take 1 tablet by mouth daily.   Yes Historical Provider, MD  omeprazole (PRILOSEC) 20 MG capsule Take 20 mg by mouth daily.   Yes Historical Provider, MD  predniSONE (DELTASONE) 20 MG tablet Take 1 tablet (20 mg total) by mouth daily with breakfast. 10/28/15  Yes Norval Gable, MD  venlafaxine (EFFEXOR) 75 MG tablet Take 75 mg by mouth 2 (two) times daily.   Yes Historical Provider, MD  oxyCODONE-acetaminophen (PERCOCET/ROXICET) 5-325 MG tablet Take 1-2 tablets by mouth every 8 (eight) hours as needed for severe pain. 11/01/15   Norval Gable, MD    Family History History reviewed. No pertinent family history.  Social History Social History  Substance Use Topics  .  Smoking status: Never Smoker  . Smokeless tobacco: Never Used  . Alcohol use Yes     Comment: 2 glasses a week     Allergies   Erythromycin; Other; Penicillins; and Sulfa antibiotics   Review of Systems Review of Systems  Musculoskeletal: Positive for back pain.     Physical Exam Triage Vital Signs ED Triage Vitals  Enc Vitals Group     BP 11/01/15 0831 (!) 159/90     Pulse Rate 11/01/15 0831 87     Resp 11/01/15 0831 16     Temp 11/01/15 0831 99 F (37.2 C)     Temp Source 11/01/15 0831 Oral     SpO2 11/01/15 0831 97 %     Weight 11/01/15 0833 180 lb (81.6 kg)     Height 11/01/15 0833 5\' 4"  (1.626 m)     Head Circumference --      Peak Flow --      Pain Score 11/01/15 0837 10     Pain Loc --      Pain Edu? --      Excl. in [place]? --    No data found.   Updated Vital Signs BP (!) 159/90 (BP Location: Right Arm)   Pulse 87   Temp 99 F (37.2 C) (Oral)   Resp 16   Ht 5\' 4"  (1.626 m)   Wt 180 lb (81.6 kg)   SpO2 97%   BMI 30.90 kg/m   Visual Acuity Right Eye Distance:   Left Eye Distance:   Bilateral Distance:    Right Eye Near:   Left Eye Near:    Bilateral Near:     Physical Exam  Constitutional: She appears well-developed and well-nourished. No distress.  Musculoskeletal: She exhibits tenderness. She exhibits no edema.       Lumbar back: She exhibits tenderness and spasm. She exhibits normal range of motion, no bony tenderness, no swelling, no edema, no deformity, no laceration, no pain and normal pulse.  Left calf tenderness to palpation  Neurological: She is alert. She has normal reflexes. She displays normal reflexes. She exhibits normal muscle tone.  Skin: Skin is warm and dry. No rash noted. She is not diaphoretic. No erythema.  Nursing note and vitals reviewed.    UC Treatments / Results  Labs (all labs ordered are listed, but only abnormal results are displayed) Labs Reviewed - No data to display  EKG  EKG Interpretation None        Radiology No results found.  Procedures Procedures (including critical care time)  Medications Ordered in UC Medications - No data to display   Initial Impression / Assessment and Plan / UC Course  I have reviewed the triage vital signs  and the nursing notes.  Pertinent labs & imaging results that were available during my care of the patient were reviewed by me and considered in my medical decision making (see chart for details).  Clinical Course      Final Clinical Impressions(s) / UC Diagnoses   Final diagnoses:  Left leg pain  Sciatica of left side    New Prescriptions Discharge Medication List as of 11/01/2015  9:15 AM     1. diagnosis reviewed with patient 2. rx as per orders above; reviewed possible side effects, interactions, risks and benefits  3. Recommend venous doppler US of left lower extremity to rule out DVT 4. Follow-up with PCP this week or prn if symptoms worsen or don't improve   Norval Gable, MD 11/01/15 [phone]

## 2015-11-01 NOTE — ED Triage Notes (Signed)
Pt seen here Wednesday for "sciatica", here today c/o continued low back pain along with left lower leg pain, ankle and foot pain. Denies injury. States left big toe feels numb.

## 2015-11-02 ENCOUNTER — Other Ambulatory Visit: Payer: Self-pay | Admitting: Physician Assistant

## 2015-11-02 DIAGNOSIS — M5432 Sciatica, left side: Secondary | ICD-10-CM

## 2015-11-24 ENCOUNTER — Ambulatory Visit
Admission: RE | Admit: 2015-11-24 | Discharge: 2015-11-24 | Disposition: A | Discharge status: Home / Self Care | Payer: Medicare Other | Source: Ambulatory Visit | Attending: Physician Assistant | Admitting: Physician Assistant

## 2015-11-24 DIAGNOSIS — M5126 Other intervertebral disc displacement, lumbar region: Secondary | ICD-10-CM | POA: Insufficient documentation

## 2015-11-24 DIAGNOSIS — M48061 Spinal stenosis, lumbar region without neurogenic claudication: Secondary | ICD-10-CM | POA: Insufficient documentation

## 2015-11-24 DIAGNOSIS — M5432 Sciatica, left side: Secondary | ICD-10-CM | POA: Insufficient documentation

## 2016-01-20 DIAGNOSIS — M5416 Radiculopathy, lumbar region: Secondary | ICD-10-CM | POA: Diagnosis not present

## 2016-01-20 DIAGNOSIS — M5136 Other intervertebral disc degeneration, lumbar region: Secondary | ICD-10-CM | POA: Diagnosis not present

## 2016-01-20 DIAGNOSIS — M48062 Spinal stenosis, lumbar region with neurogenic claudication: Secondary | ICD-10-CM | POA: Diagnosis not present

## 2016-01-25 DIAGNOSIS — M5136 Other intervertebral disc degeneration, lumbar region: Secondary | ICD-10-CM | POA: Diagnosis not present

## 2016-01-25 DIAGNOSIS — M48062 Spinal stenosis, lumbar region with neurogenic claudication: Secondary | ICD-10-CM | POA: Diagnosis not present

## 2016-01-25 DIAGNOSIS — M5416 Radiculopathy, lumbar region: Secondary | ICD-10-CM | POA: Diagnosis not present

## 2016-03-15 DIAGNOSIS — M48062 Spinal stenosis, lumbar region with neurogenic claudication: Secondary | ICD-10-CM | POA: Diagnosis not present

## 2016-03-15 DIAGNOSIS — M5136 Other intervertebral disc degeneration, lumbar region: Secondary | ICD-10-CM | POA: Diagnosis not present

## 2016-03-15 DIAGNOSIS — M5416 Radiculopathy, lumbar region: Secondary | ICD-10-CM | POA: Diagnosis not present

## 2016-03-17 DIAGNOSIS — E039 Hypothyroidism, unspecified: Secondary | ICD-10-CM | POA: Diagnosis not present

## 2016-03-17 DIAGNOSIS — Z0000 Encounter for general adult medical examination without abnormal findings: Secondary | ICD-10-CM | POA: Diagnosis not present

## 2016-03-17 DIAGNOSIS — E7800 Pure hypercholesterolemia, unspecified: Secondary | ICD-10-CM | POA: Diagnosis not present

## 2016-03-17 DIAGNOSIS — I10 Essential (primary) hypertension: Secondary | ICD-10-CM | POA: Diagnosis not present

## 2016-03-24 DIAGNOSIS — Z0000 Encounter for general adult medical examination without abnormal findings: Secondary | ICD-10-CM | POA: Diagnosis not present

## 2016-03-24 DIAGNOSIS — E039 Hypothyroidism, unspecified: Secondary | ICD-10-CM | POA: Diagnosis not present

## 2016-03-24 DIAGNOSIS — E7800 Pure hypercholesterolemia, unspecified: Secondary | ICD-10-CM | POA: Diagnosis not present

## 2016-03-24 DIAGNOSIS — I10 Essential (primary) hypertension: Secondary | ICD-10-CM | POA: Diagnosis not present

## 2016-04-08 DIAGNOSIS — M8588 Other specified disorders of bone density and structure, other site: Secondary | ICD-10-CM | POA: Diagnosis not present

## 2016-05-09 ENCOUNTER — Other Ambulatory Visit: Payer: Self-pay | Admitting: Family Medicine

## 2016-05-09 DIAGNOSIS — Z1231 Encounter for screening mammogram for malignant neoplasm of breast: Secondary | ICD-10-CM

## 2016-05-12 ENCOUNTER — Other Ambulatory Visit: Payer: Self-pay | Admitting: Family Medicine

## 2016-05-12 ENCOUNTER — Ambulatory Visit
Admission: RE | Admit: 2016-05-12 | Discharge: 2016-05-12 | Disposition: A | Discharge status: Home / Self Care | Payer: PPO | Source: Ambulatory Visit | Attending: Family Medicine | Admitting: Family Medicine

## 2016-05-12 DIAGNOSIS — Z1231 Encounter for screening mammogram for malignant neoplasm of breast: Secondary | ICD-10-CM

## 2016-05-12 HISTORY — DX: Personal history of irradiation: Z92.3

## 2016-06-09 DIAGNOSIS — M5136 Other intervertebral disc degeneration, lumbar region: Secondary | ICD-10-CM | POA: Diagnosis not present

## 2016-06-09 DIAGNOSIS — M48062 Spinal stenosis, lumbar region with neurogenic claudication: Secondary | ICD-10-CM | POA: Diagnosis not present

## 2016-06-09 DIAGNOSIS — M5416 Radiculopathy, lumbar region: Secondary | ICD-10-CM | POA: Diagnosis not present

## 2016-06-23 DIAGNOSIS — E7800 Pure hypercholesterolemia, unspecified: Secondary | ICD-10-CM | POA: Diagnosis not present

## 2016-06-23 DIAGNOSIS — I10 Essential (primary) hypertension: Secondary | ICD-10-CM | POA: Diagnosis not present

## 2016-06-30 DIAGNOSIS — E7800 Pure hypercholesterolemia, unspecified: Secondary | ICD-10-CM | POA: Diagnosis not present

## 2016-06-30 DIAGNOSIS — I10 Essential (primary) hypertension: Secondary | ICD-10-CM | POA: Diagnosis not present

## 2016-06-30 DIAGNOSIS — E039 Hypothyroidism, unspecified: Secondary | ICD-10-CM | POA: Diagnosis not present

## 2016-09-12 DIAGNOSIS — M48062 Spinal stenosis, lumbar region with neurogenic claudication: Secondary | ICD-10-CM | POA: Diagnosis not present

## 2016-09-12 DIAGNOSIS — M5136 Other intervertebral disc degeneration, lumbar region: Secondary | ICD-10-CM | POA: Diagnosis not present

## 2016-09-12 DIAGNOSIS — M5416 Radiculopathy, lumbar region: Secondary | ICD-10-CM | POA: Diagnosis not present

## 2016-10-20 DIAGNOSIS — M5136 Other intervertebral disc degeneration, lumbar region: Secondary | ICD-10-CM | POA: Diagnosis not present

## 2016-10-20 DIAGNOSIS — M48062 Spinal stenosis, lumbar region with neurogenic claudication: Secondary | ICD-10-CM | POA: Diagnosis not present

## 2016-10-20 DIAGNOSIS — M5416 Radiculopathy, lumbar region: Secondary | ICD-10-CM | POA: Diagnosis not present

## 2016-11-07 DIAGNOSIS — M5416 Radiculopathy, lumbar region: Secondary | ICD-10-CM | POA: Diagnosis not present

## 2016-11-07 DIAGNOSIS — M5136 Other intervertebral disc degeneration, lumbar region: Secondary | ICD-10-CM | POA: Diagnosis not present

## 2016-11-07 DIAGNOSIS — M48062 Spinal stenosis, lumbar region with neurogenic claudication: Secondary | ICD-10-CM | POA: Diagnosis not present

## 2016-12-29 DIAGNOSIS — E039 Hypothyroidism, unspecified: Secondary | ICD-10-CM | POA: Diagnosis not present

## 2016-12-29 DIAGNOSIS — E7800 Pure hypercholesterolemia, unspecified: Secondary | ICD-10-CM | POA: Diagnosis not present

## 2016-12-29 DIAGNOSIS — I10 Essential (primary) hypertension: Secondary | ICD-10-CM | POA: Diagnosis not present

## 2017-01-02 DIAGNOSIS — F418 Other specified anxiety disorders: Secondary | ICD-10-CM | POA: Diagnosis not present

## 2017-01-02 DIAGNOSIS — E7800 Pure hypercholesterolemia, unspecified: Secondary | ICD-10-CM | POA: Diagnosis not present

## 2017-01-02 DIAGNOSIS — I10 Essential (primary) hypertension: Secondary | ICD-10-CM | POA: Diagnosis not present

## 2017-01-02 DIAGNOSIS — E039 Hypothyroidism, unspecified: Secondary | ICD-10-CM | POA: Diagnosis not present

## 2017-02-07 DIAGNOSIS — M5416 Radiculopathy, lumbar region: Secondary | ICD-10-CM | POA: Diagnosis not present

## 2017-02-07 DIAGNOSIS — M5136 Other intervertebral disc degeneration, lumbar region: Secondary | ICD-10-CM | POA: Diagnosis not present

## 2017-02-07 DIAGNOSIS — M48062 Spinal stenosis, lumbar region with neurogenic claudication: Secondary | ICD-10-CM | POA: Diagnosis not present

## 2017-03-28 DIAGNOSIS — M5416 Radiculopathy, lumbar region: Secondary | ICD-10-CM | POA: Diagnosis not present

## 2017-03-28 DIAGNOSIS — M5136 Other intervertebral disc degeneration, lumbar region: Secondary | ICD-10-CM | POA: Diagnosis not present

## 2017-03-28 DIAGNOSIS — M48062 Spinal stenosis, lumbar region with neurogenic claudication: Secondary | ICD-10-CM | POA: Diagnosis not present

## 2017-04-24 DIAGNOSIS — E039 Hypothyroidism, unspecified: Secondary | ICD-10-CM | POA: Diagnosis not present

## 2017-04-24 DIAGNOSIS — E7800 Pure hypercholesterolemia, unspecified: Secondary | ICD-10-CM | POA: Diagnosis not present

## 2017-04-24 DIAGNOSIS — I10 Essential (primary) hypertension: Secondary | ICD-10-CM | POA: Diagnosis not present

## 2017-04-25 DIAGNOSIS — M5136 Other intervertebral disc degeneration, lumbar region: Secondary | ICD-10-CM | POA: Diagnosis not present

## 2017-04-25 DIAGNOSIS — M5416 Radiculopathy, lumbar region: Secondary | ICD-10-CM | POA: Diagnosis not present

## 2017-04-25 DIAGNOSIS — M48062 Spinal stenosis, lumbar region with neurogenic claudication: Secondary | ICD-10-CM | POA: Diagnosis not present

## 2017-05-03 DIAGNOSIS — E039 Hypothyroidism, unspecified: Secondary | ICD-10-CM | POA: Diagnosis not present

## 2017-05-03 DIAGNOSIS — I10 Essential (primary) hypertension: Secondary | ICD-10-CM | POA: Diagnosis not present

## 2017-05-03 DIAGNOSIS — Z0000 Encounter for general adult medical examination without abnormal findings: Secondary | ICD-10-CM | POA: Diagnosis not present

## 2017-05-03 DIAGNOSIS — E7800 Pure hypercholesterolemia, unspecified: Secondary | ICD-10-CM | POA: Diagnosis not present

## 2017-05-03 DIAGNOSIS — E669 Obesity, unspecified: Secondary | ICD-10-CM | POA: Diagnosis not present

## 2017-05-10 DIAGNOSIS — M5416 Radiculopathy, lumbar region: Secondary | ICD-10-CM | POA: Diagnosis not present

## 2017-05-10 DIAGNOSIS — M5136 Other intervertebral disc degeneration, lumbar region: Secondary | ICD-10-CM | POA: Diagnosis not present

## 2017-05-10 DIAGNOSIS — M48062 Spinal stenosis, lumbar region with neurogenic claudication: Secondary | ICD-10-CM | POA: Diagnosis not present

## 2017-06-07 IMAGING — US US BREAST LTD UNI LEFT INC AXILLA
1 series · 11 of 11 positions shown · non-contrast
Comparison: Previous exam(s).

CLINICAL DATA: Possible mass in the upper outer quadrant of the
left breast on a recent screening mammogram. Status post left
lumpectomy and radiation therapy for breast cancer in 4335. The
patient was also treated with tamoxifen.

EXAM:
2D DIGITAL DIAGNOSTIC LEFT MAMMOGRAM WITH CAD AND ADJUNCT TOMO
ULTRASOUND LEFT BREAST

[Series 1: us breast ltd uni left inc axilla · 0.07mm/px · 11 of 11 slices shown]
[im 1/11]
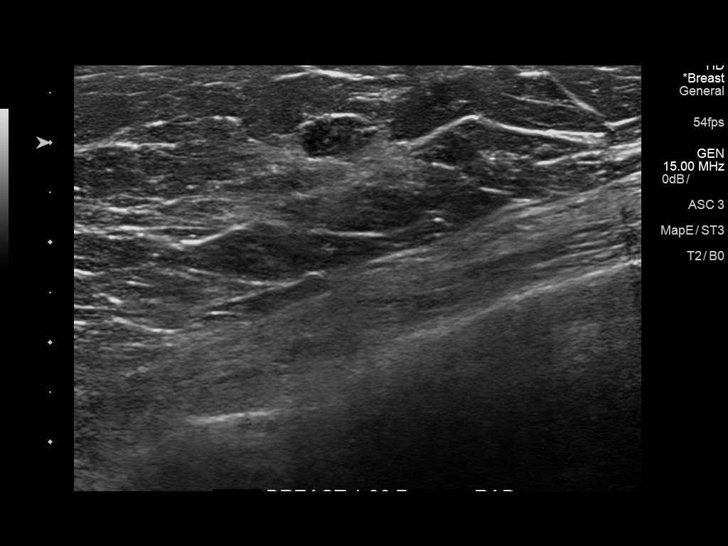
[im 2/11]
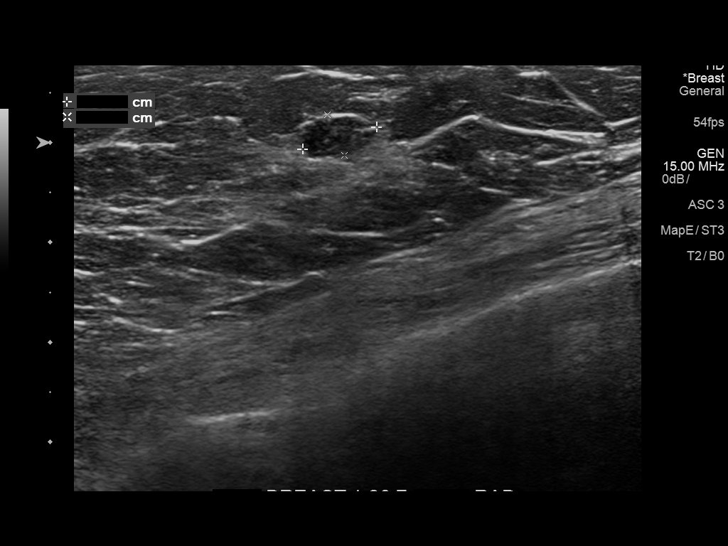
[im 3/11]
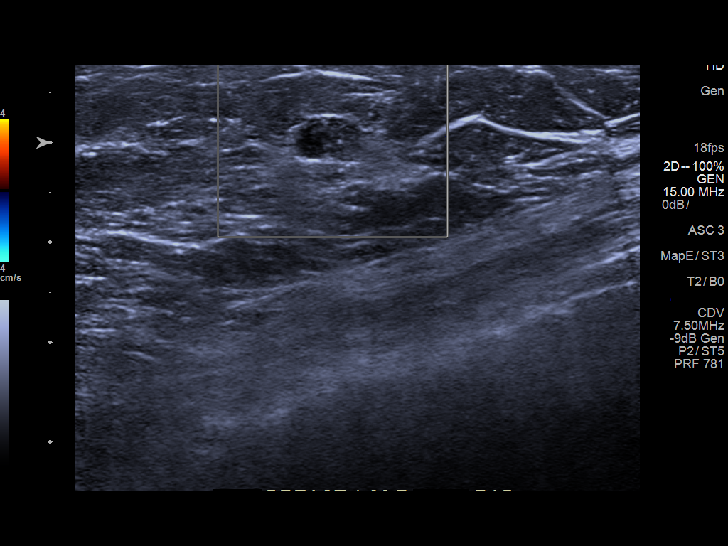
[im 4/11]
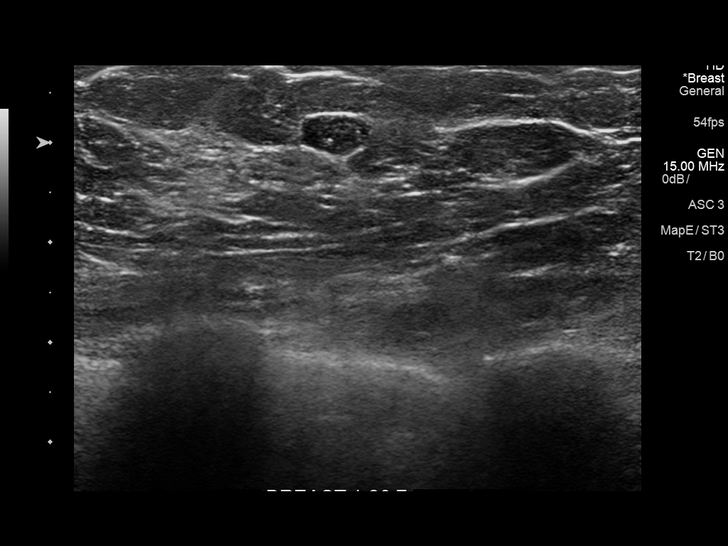
[im 5/11]
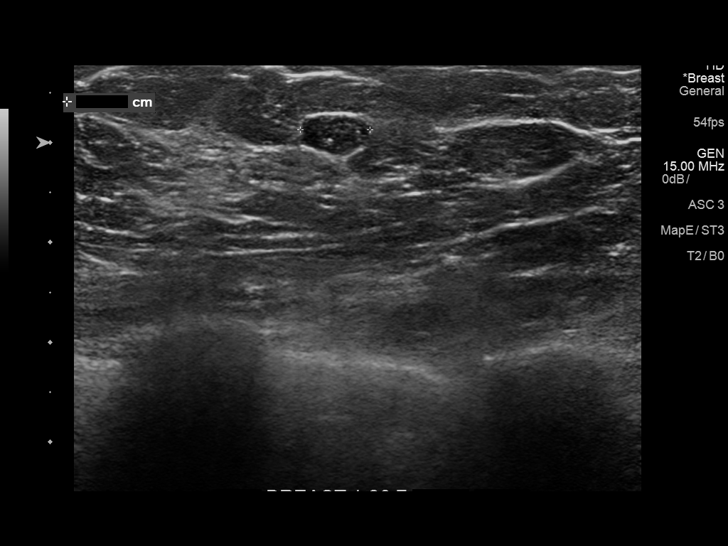
[im 6/11]
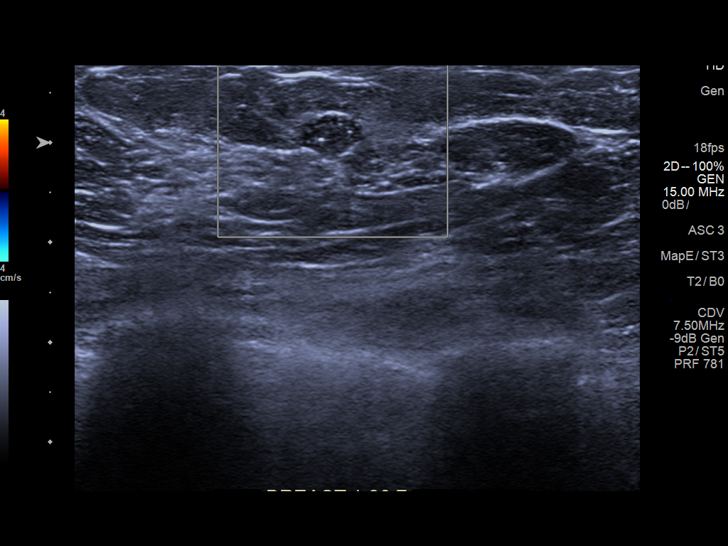
[im 7/11]
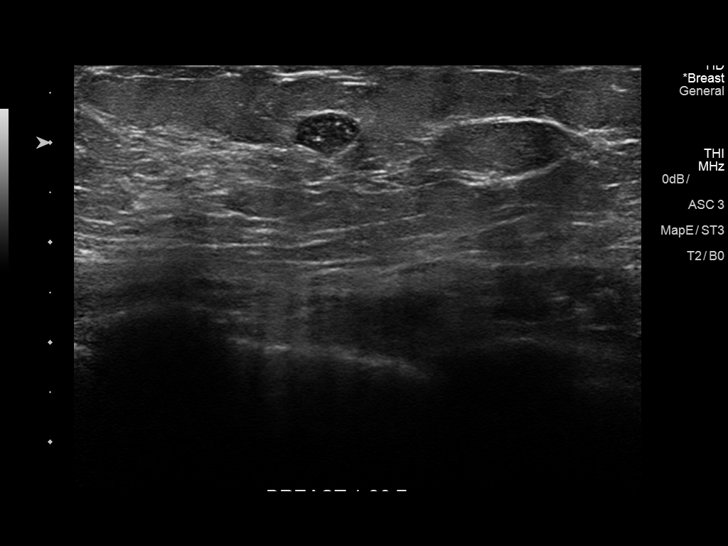
[im 8/11]
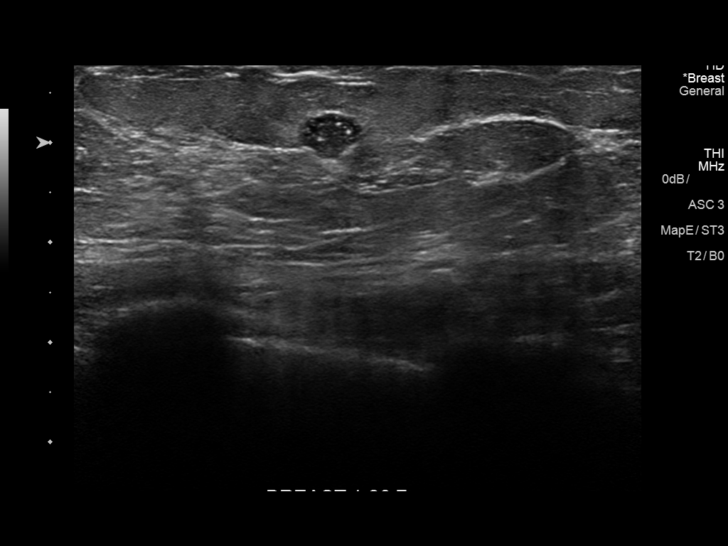
[im 9/11]
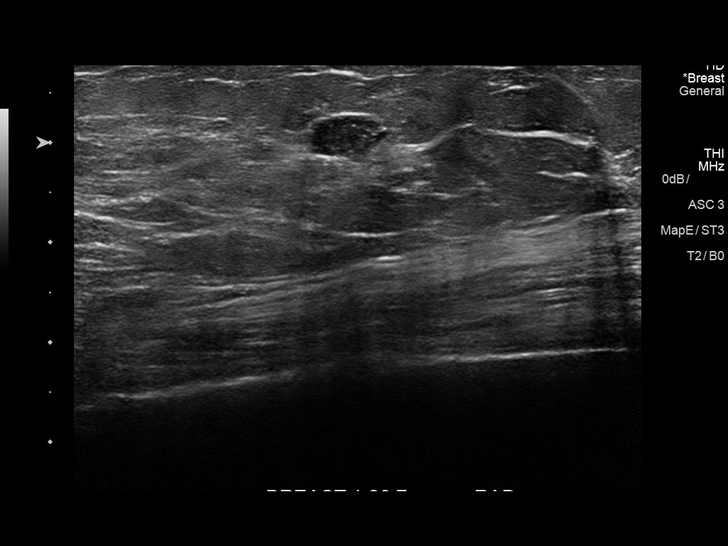
[im 10/11]
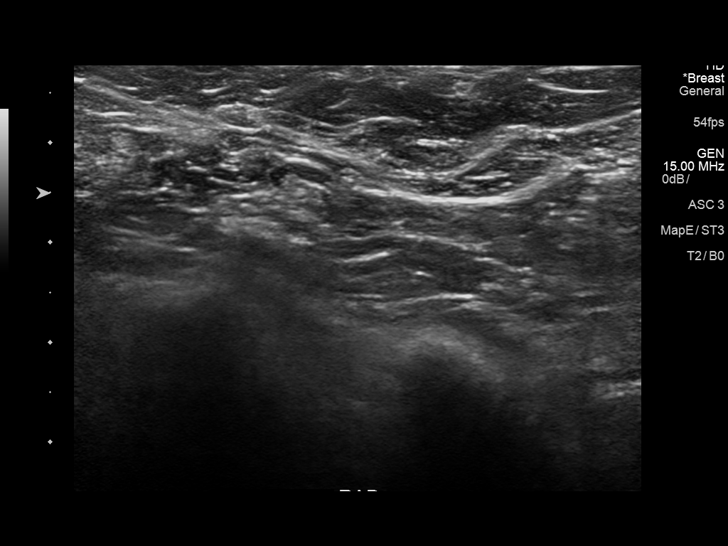
[im 11/11]
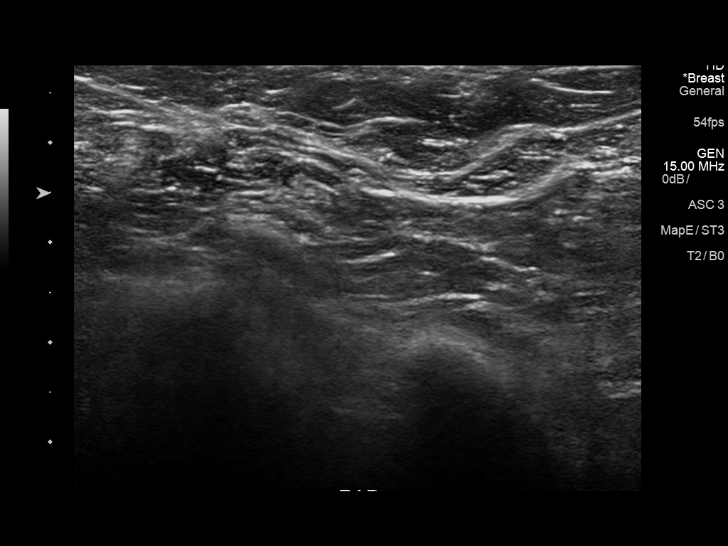

[11 of 11 positions shown; findings below may reference images not displayed]

ACR Breast Density Category b: There are scattered areas of
fibroglandular density.
FINDINGS: 3D tomographic images of the left breast confirm a 7 mm oval,
circumscribed mass in the posterior aspect of the upper-outer
quadrant of the breast.

Mammographic images were processed with CAD.

On physical exam, no mass is palpable in the upper outer left breast
or left axilla.

Targeted ultrasound is performed, showing an 8 x 7 x 4 mm oval,
horizontally oriented, circumscribed, hypoechoic mass in the 1:30
o'clock position of the left breast, 7 cm from the nipple. This
contains diffuse internal echoes and some punctate, bright internal
echoes. This also contains a mildly thickened partial internal
septation.

Ultrasound of the left axilla demonstrated normal appearing lymph
nodes.
IMPRESSION: 8 mm mass in the 1:30 o'clock position of the left breast, 7 cm from
the nipple. Differential considerations include malignancy and
complicated cyst.

RECOMMENDATION:
Ultrasound-guided core needle biopsy of the 8 mm mass in the 1:30
o'clock position of the left breast. This will be arranged in
consultation with the patient's physician.

I have discussed the findings and recommendations with the patient.
Results were also provided in writing at the conclusion of the
visit. If applicable, a reminder letter will be sent to the patient
regarding the next appointment.

BI-RADS CATEGORY  4: Suspicious.

## 2017-06-19 DIAGNOSIS — M5416 Radiculopathy, lumbar region: Secondary | ICD-10-CM | POA: Diagnosis not present

## 2017-06-19 DIAGNOSIS — M48062 Spinal stenosis, lumbar region with neurogenic claudication: Secondary | ICD-10-CM | POA: Diagnosis not present

## 2017-06-19 DIAGNOSIS — M5136 Other intervertebral disc degeneration, lumbar region: Secondary | ICD-10-CM | POA: Diagnosis not present

## 2017-07-25 ENCOUNTER — Other Ambulatory Visit: Payer: Self-pay | Admitting: Family Medicine

## 2017-07-25 DIAGNOSIS — Z1231 Encounter for screening mammogram for malignant neoplasm of breast: Secondary | ICD-10-CM

## 2017-08-02 ENCOUNTER — Ambulatory Visit
Admission: RE | Admit: 2017-08-02 | Discharge: 2017-08-02 | Disposition: A | Discharge status: Home / Self Care | Payer: PPO | Source: Ambulatory Visit | Attending: Family Medicine | Admitting: Family Medicine

## 2017-08-02 DIAGNOSIS — Z1231 Encounter for screening mammogram for malignant neoplasm of breast: Secondary | ICD-10-CM | POA: Diagnosis not present

## 2017-09-07 DIAGNOSIS — I10 Essential (primary) hypertension: Secondary | ICD-10-CM | POA: Diagnosis not present

## 2017-09-07 DIAGNOSIS — E039 Hypothyroidism, unspecified: Secondary | ICD-10-CM | POA: Diagnosis not present

## 2017-09-07 DIAGNOSIS — E7800 Pure hypercholesterolemia, unspecified: Secondary | ICD-10-CM | POA: Diagnosis not present

## 2017-09-14 DIAGNOSIS — F418 Other specified anxiety disorders: Secondary | ICD-10-CM | POA: Diagnosis not present

## 2017-09-14 DIAGNOSIS — E669 Obesity, unspecified: Secondary | ICD-10-CM | POA: Diagnosis not present

## 2017-09-14 DIAGNOSIS — E7800 Pure hypercholesterolemia, unspecified: Secondary | ICD-10-CM | POA: Diagnosis not present

## 2017-09-14 DIAGNOSIS — E039 Hypothyroidism, unspecified: Secondary | ICD-10-CM | POA: Diagnosis not present

## 2017-09-14 DIAGNOSIS — I10 Essential (primary) hypertension: Secondary | ICD-10-CM | POA: Diagnosis not present

## 2017-09-26 DIAGNOSIS — H5213 Myopia, bilateral: Secondary | ICD-10-CM | POA: Diagnosis not present

## 2017-10-20 DIAGNOSIS — M5136 Other intervertebral disc degeneration, lumbar region: Secondary | ICD-10-CM | POA: Diagnosis not present

## 2017-10-20 DIAGNOSIS — M48062 Spinal stenosis, lumbar region with neurogenic claudication: Secondary | ICD-10-CM | POA: Diagnosis not present

## 2017-10-20 DIAGNOSIS — M5416 Radiculopathy, lumbar region: Secondary | ICD-10-CM | POA: Diagnosis not present

## 2017-11-20 DIAGNOSIS — M5136 Other intervertebral disc degeneration, lumbar region: Secondary | ICD-10-CM | POA: Diagnosis not present

## 2017-11-20 DIAGNOSIS — M5416 Radiculopathy, lumbar region: Secondary | ICD-10-CM | POA: Diagnosis not present

## 2017-11-20 DIAGNOSIS — M48062 Spinal stenosis, lumbar region with neurogenic claudication: Secondary | ICD-10-CM | POA: Diagnosis not present

## 2017-12-18 ENCOUNTER — Other Ambulatory Visit: Payer: Self-pay | Admitting: Physical Medicine and Rehabilitation

## 2017-12-18 DIAGNOSIS — M5442 Lumbago with sciatica, left side: Secondary | ICD-10-CM | POA: Diagnosis not present

## 2017-12-18 DIAGNOSIS — M5136 Other intervertebral disc degeneration, lumbar region: Secondary | ICD-10-CM | POA: Diagnosis not present

## 2017-12-18 DIAGNOSIS — M5416 Radiculopathy, lumbar region: Secondary | ICD-10-CM

## 2017-12-18 DIAGNOSIS — M48062 Spinal stenosis, lumbar region with neurogenic claudication: Secondary | ICD-10-CM | POA: Diagnosis not present

## 2017-12-18 DIAGNOSIS — M5441 Lumbago with sciatica, right side: Secondary | ICD-10-CM | POA: Diagnosis not present

## 2017-12-29 ENCOUNTER — Encounter (INDEPENDENT_AMBULATORY_CARE_PROVIDER_SITE_OTHER): Payer: Self-pay

## 2017-12-29 ENCOUNTER — Ambulatory Visit
Admission: RE | Admit: 2017-12-29 | Discharge: 2017-12-29 | Disposition: A | Discharge status: Home / Self Care | Payer: PPO | Source: Ambulatory Visit | Attending: Physical Medicine and Rehabilitation | Admitting: Physical Medicine and Rehabilitation

## 2017-12-29 DIAGNOSIS — M5416 Radiculopathy, lumbar region: Secondary | ICD-10-CM | POA: Diagnosis present

## 2017-12-29 DIAGNOSIS — M5116 Intervertebral disc disorders with radiculopathy, lumbar region: Secondary | ICD-10-CM | POA: Insufficient documentation

## 2017-12-29 DIAGNOSIS — M48061 Spinal stenosis, lumbar region without neurogenic claudication: Secondary | ICD-10-CM | POA: Diagnosis not present

## 2018-01-11 DIAGNOSIS — M48062 Spinal stenosis, lumbar region with neurogenic claudication: Secondary | ICD-10-CM | POA: Diagnosis not present

## 2018-01-11 DIAGNOSIS — M5416 Radiculopathy, lumbar region: Secondary | ICD-10-CM | POA: Diagnosis not present

## 2018-01-11 DIAGNOSIS — M5136 Other intervertebral disc degeneration, lumbar region: Secondary | ICD-10-CM | POA: Diagnosis not present

## 2018-02-19 DIAGNOSIS — M5416 Radiculopathy, lumbar region: Secondary | ICD-10-CM | POA: Diagnosis not present

## 2018-02-19 DIAGNOSIS — M5136 Other intervertebral disc degeneration, lumbar region: Secondary | ICD-10-CM | POA: Diagnosis not present

## 2018-02-19 DIAGNOSIS — M48062 Spinal stenosis, lumbar region with neurogenic claudication: Secondary | ICD-10-CM | POA: Diagnosis not present

## 2018-03-12 DIAGNOSIS — E7800 Pure hypercholesterolemia, unspecified: Secondary | ICD-10-CM | POA: Diagnosis not present

## 2018-03-12 DIAGNOSIS — I10 Essential (primary) hypertension: Secondary | ICD-10-CM | POA: Diagnosis not present

## 2018-03-12 DIAGNOSIS — E039 Hypothyroidism, unspecified: Secondary | ICD-10-CM | POA: Diagnosis not present

## 2018-03-16 DIAGNOSIS — Z0000 Encounter for general adult medical examination without abnormal findings: Secondary | ICD-10-CM | POA: Diagnosis not present

## 2018-03-16 DIAGNOSIS — F418 Other specified anxiety disorders: Secondary | ICD-10-CM | POA: Diagnosis not present

## 2018-03-16 DIAGNOSIS — I10 Essential (primary) hypertension: Secondary | ICD-10-CM | POA: Diagnosis not present

## 2018-03-16 DIAGNOSIS — E039 Hypothyroidism, unspecified: Secondary | ICD-10-CM | POA: Diagnosis not present

## 2018-03-16 DIAGNOSIS — E669 Obesity, unspecified: Secondary | ICD-10-CM | POA: Diagnosis not present

## 2018-05-29 DIAGNOSIS — M5416 Radiculopathy, lumbar region: Secondary | ICD-10-CM | POA: Diagnosis not present

## 2018-05-29 DIAGNOSIS — M5136 Other intervertebral disc degeneration, lumbar region: Secondary | ICD-10-CM | POA: Diagnosis not present

## 2018-05-29 DIAGNOSIS — M48062 Spinal stenosis, lumbar region with neurogenic claudication: Secondary | ICD-10-CM | POA: Diagnosis not present

## 2018-06-26 DIAGNOSIS — M5136 Other intervertebral disc degeneration, lumbar region: Secondary | ICD-10-CM | POA: Diagnosis not present

## 2018-06-26 DIAGNOSIS — M48062 Spinal stenosis, lumbar region with neurogenic claudication: Secondary | ICD-10-CM | POA: Diagnosis not present

## 2018-06-26 DIAGNOSIS — M5416 Radiculopathy, lumbar region: Secondary | ICD-10-CM | POA: Diagnosis not present

## 2018-08-20 DIAGNOSIS — M5136 Other intervertebral disc degeneration, lumbar region: Secondary | ICD-10-CM | POA: Diagnosis not present

## 2018-08-20 DIAGNOSIS — M5416 Radiculopathy, lumbar region: Secondary | ICD-10-CM | POA: Diagnosis not present

## 2018-08-20 DIAGNOSIS — M48062 Spinal stenosis, lumbar region with neurogenic claudication: Secondary | ICD-10-CM | POA: Diagnosis not present

## 2018-09-07 DIAGNOSIS — E039 Hypothyroidism, unspecified: Secondary | ICD-10-CM | POA: Diagnosis not present

## 2018-09-07 DIAGNOSIS — I10 Essential (primary) hypertension: Secondary | ICD-10-CM | POA: Diagnosis not present

## 2018-09-14 DIAGNOSIS — Z1159 Encounter for screening for other viral diseases: Secondary | ICD-10-CM | POA: Diagnosis not present

## 2018-09-14 DIAGNOSIS — E669 Obesity, unspecified: Secondary | ICD-10-CM | POA: Diagnosis not present

## 2018-09-14 DIAGNOSIS — F418 Other specified anxiety disorders: Secondary | ICD-10-CM | POA: Diagnosis not present

## 2018-09-14 DIAGNOSIS — E039 Hypothyroidism, unspecified: Secondary | ICD-10-CM | POA: Diagnosis not present

## 2018-09-14 DIAGNOSIS — Z136 Encounter for screening for cardiovascular disorders: Secondary | ICD-10-CM | POA: Diagnosis not present

## 2018-09-14 DIAGNOSIS — I10 Essential (primary) hypertension: Secondary | ICD-10-CM | POA: Diagnosis not present

## 2019-02-18 DIAGNOSIS — M48062 Spinal stenosis, lumbar region with neurogenic claudication: Secondary | ICD-10-CM | POA: Diagnosis not present

## 2019-02-18 DIAGNOSIS — M5416 Radiculopathy, lumbar region: Secondary | ICD-10-CM | POA: Diagnosis not present

## 2019-02-18 DIAGNOSIS — M5136 Other intervertebral disc degeneration, lumbar region: Secondary | ICD-10-CM | POA: Diagnosis not present

## 2019-03-12 DIAGNOSIS — Z1159 Encounter for screening for other viral diseases: Secondary | ICD-10-CM | POA: Diagnosis not present

## 2019-03-12 DIAGNOSIS — Z136 Encounter for screening for cardiovascular disorders: Secondary | ICD-10-CM | POA: Diagnosis not present

## 2019-03-12 DIAGNOSIS — I10 Essential (primary) hypertension: Secondary | ICD-10-CM | POA: Diagnosis not present

## 2019-03-12 DIAGNOSIS — E039 Hypothyroidism, unspecified: Secondary | ICD-10-CM | POA: Diagnosis not present

## 2019-03-19 DIAGNOSIS — E7800 Pure hypercholesterolemia, unspecified: Secondary | ICD-10-CM | POA: Diagnosis not present

## 2019-03-19 DIAGNOSIS — E669 Obesity, unspecified: Secondary | ICD-10-CM | POA: Diagnosis not present

## 2019-03-19 DIAGNOSIS — I10 Essential (primary) hypertension: Secondary | ICD-10-CM | POA: Diagnosis not present

## 2019-03-19 DIAGNOSIS — Z0000 Encounter for general adult medical examination without abnormal findings: Secondary | ICD-10-CM | POA: Diagnosis not present

## 2019-03-19 DIAGNOSIS — E039 Hypothyroidism, unspecified: Secondary | ICD-10-CM | POA: Diagnosis not present

## 2019-03-19 DIAGNOSIS — F418 Other specified anxiety disorders: Secondary | ICD-10-CM | POA: Diagnosis not present

## 2019-08-19 DIAGNOSIS — M48062 Spinal stenosis, lumbar region with neurogenic claudication: Secondary | ICD-10-CM | POA: Diagnosis not present

## 2019-08-19 DIAGNOSIS — M5136 Other intervertebral disc degeneration, lumbar region: Secondary | ICD-10-CM | POA: Diagnosis not present

## 2019-08-19 DIAGNOSIS — M5416 Radiculopathy, lumbar region: Secondary | ICD-10-CM | POA: Diagnosis not present

## 2019-09-17 DIAGNOSIS — E039 Hypothyroidism, unspecified: Secondary | ICD-10-CM | POA: Diagnosis not present

## 2019-09-17 DIAGNOSIS — E7800 Pure hypercholesterolemia, unspecified: Secondary | ICD-10-CM | POA: Diagnosis not present

## 2019-09-24 DIAGNOSIS — I10 Essential (primary) hypertension: Secondary | ICD-10-CM | POA: Diagnosis not present

## 2019-09-24 DIAGNOSIS — E039 Hypothyroidism, unspecified: Secondary | ICD-10-CM | POA: Diagnosis not present

## 2019-09-24 DIAGNOSIS — Z0000 Encounter for general adult medical examination without abnormal findings: Secondary | ICD-10-CM | POA: Diagnosis not present

## 2019-09-24 DIAGNOSIS — E871 Hypo-osmolality and hyponatremia: Secondary | ICD-10-CM | POA: Diagnosis not present

## 2019-10-08 DIAGNOSIS — E871 Hypo-osmolality and hyponatremia: Secondary | ICD-10-CM | POA: Diagnosis not present

## 2019-11-08 DIAGNOSIS — H31011 Macula scars of posterior pole (postinflammatory) (post-traumatic), right eye: Secondary | ICD-10-CM | POA: Diagnosis not present

## 2019-11-08 DIAGNOSIS — H2513 Age-related nuclear cataract, bilateral: Secondary | ICD-10-CM | POA: Diagnosis not present

## 2020-01-10 IMAGING — MR MR LUMBAR SPINE W/O CM
4 of 5 series · 25 of 48 positions shown · non-contrast
Comparison: 11/24/2015

CLINICAL DATA: Lumbar radiculopathy. Left leg pain extending to the
foot

EXAM:
MRI LUMBAR SPINE WITHOUT CONTRAST
TECHNIQUE: Multiplanar, multisequence MR imaging of the lumbar spine was
performed. No intravenous contrast was administered.

[Series 2: T2 · sagittal · 4.0mm · 0.81mm/px · 7 of 15 slices shown (1 of 2)]
[im 1/15]
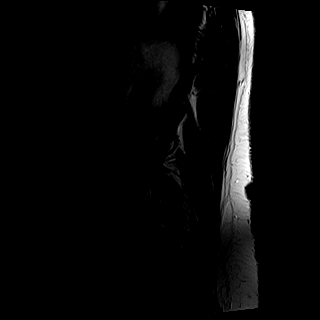
[im 3/15]
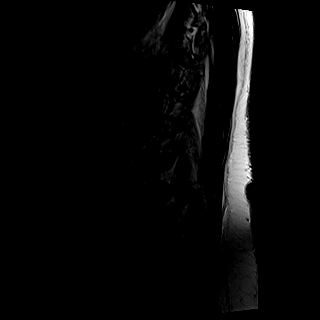
[im 5/15]
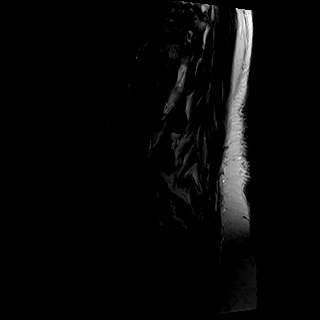
[im 8/15]
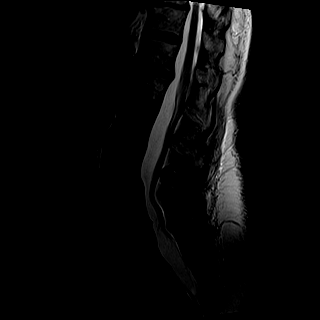
[im 10/15]
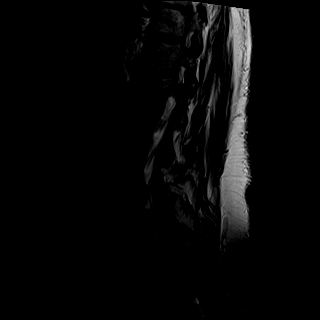
[im 12/15]
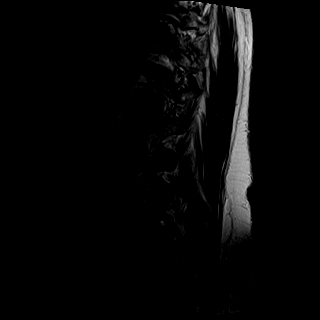
[im 15/15]
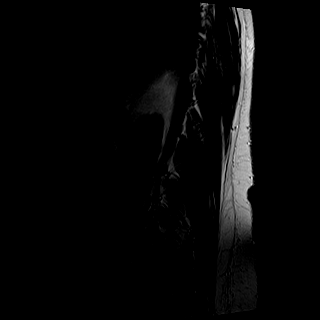

[Series 3: T1 · sagittal · 4.0mm · 0.41mm/px · 7 of 15 slices shown (1 of 2)]
[im 1/15]
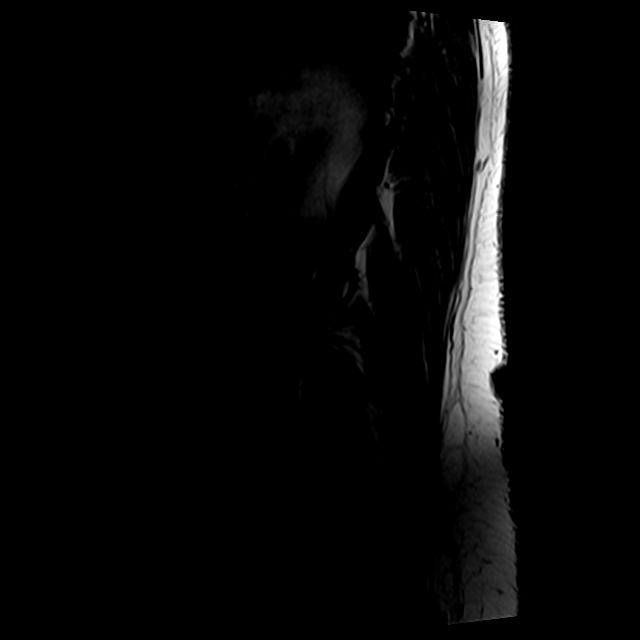
[im 3/15]
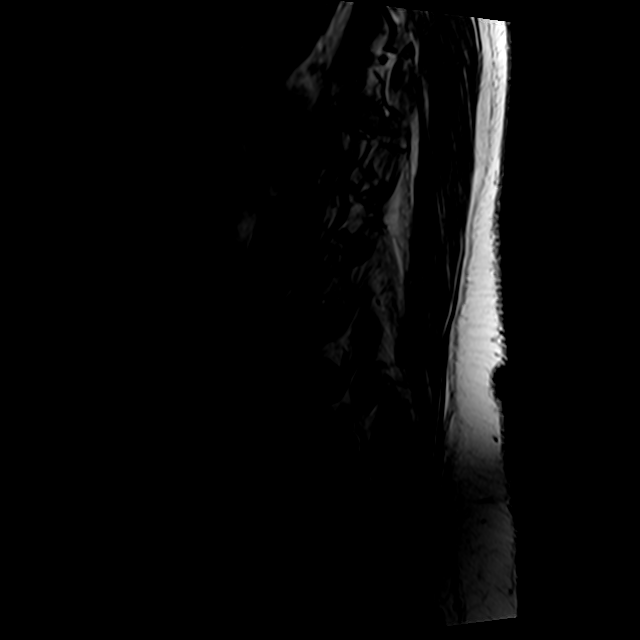
[im 5/15]
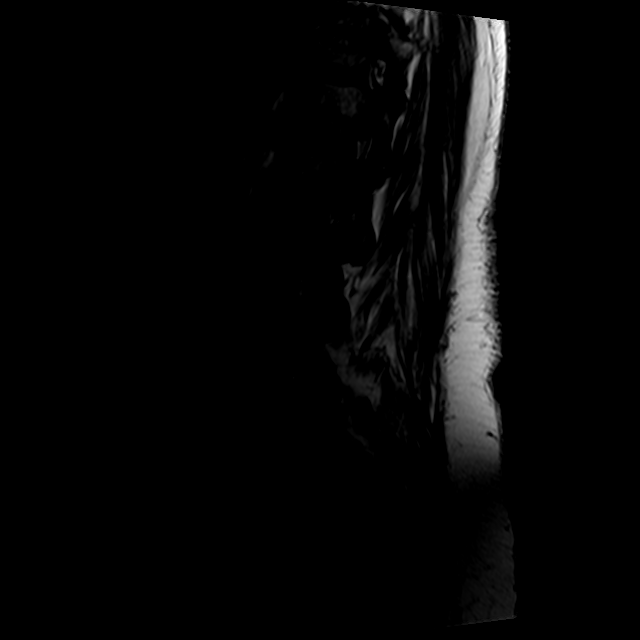
[im 8/15]
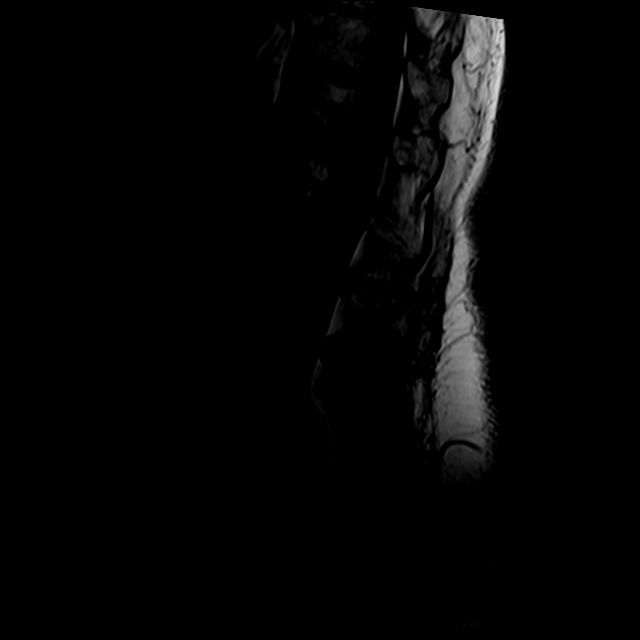
[im 10/15]
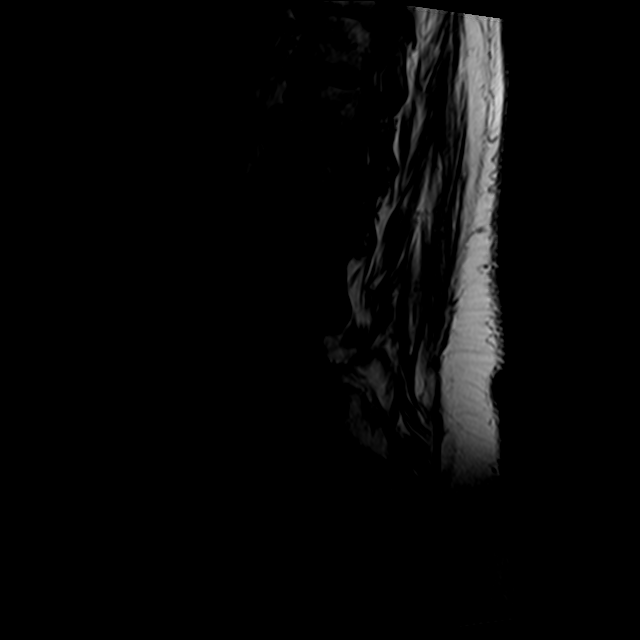
[im 12/15]
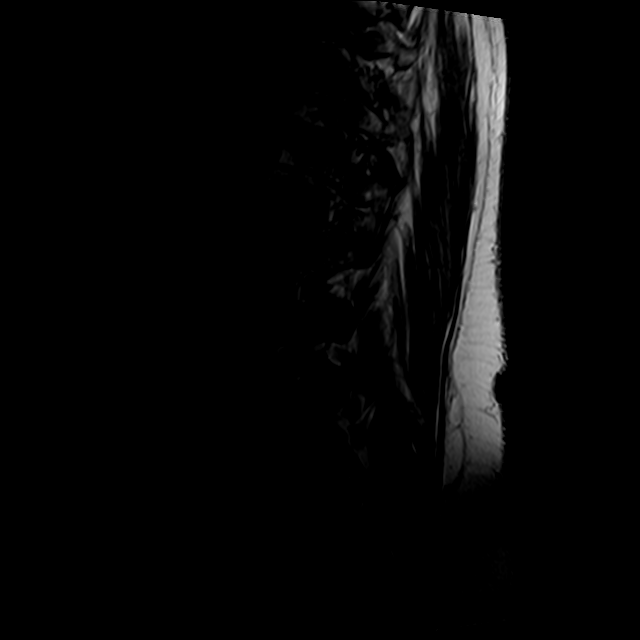
[im 15/15]
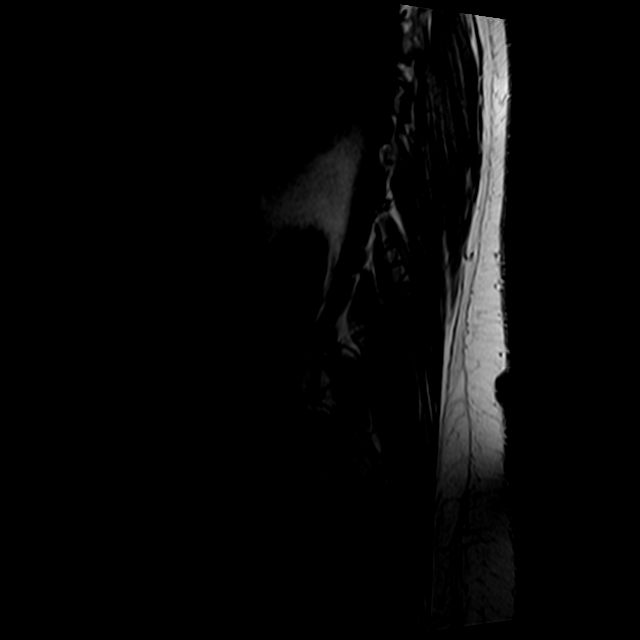

[Series 5: T2 · axial · 4.0mm · 0.78mm/px · z∈[-44,+158]mm · 8 of 34 slices shown (2 of 2)]
[im 1/34]
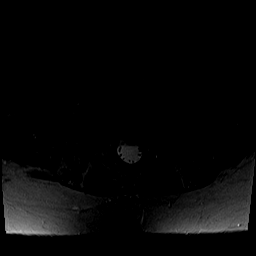
[im 6/34]
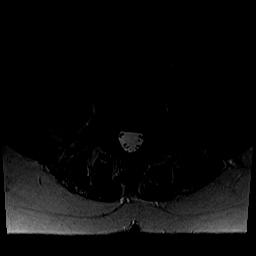
[im 11/34]
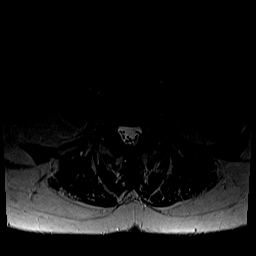
[im 16/34]
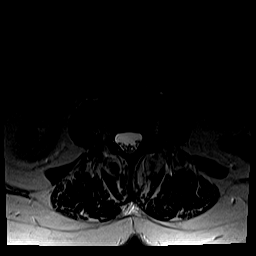
[im 18/34]
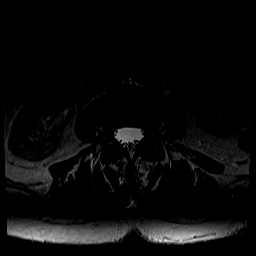
[im 23/34]
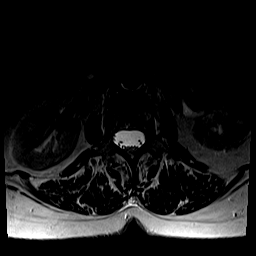
[im 28/34]
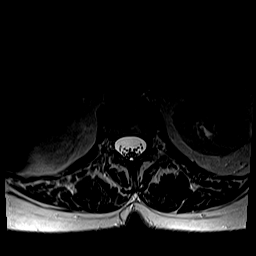
[im 34/34]
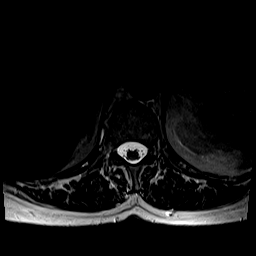

[Series 6: T1 · axial · 4.0mm · 0.39mm/px · z∈[-19,+128]mm · 3 of 34 slices shown (2 of 2)]
[im 6/34]
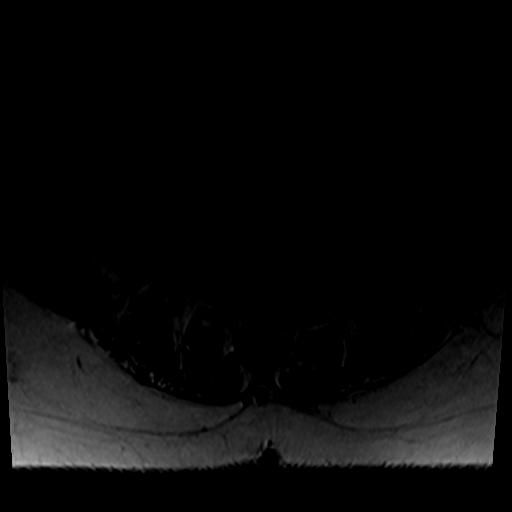
[im 18/34]
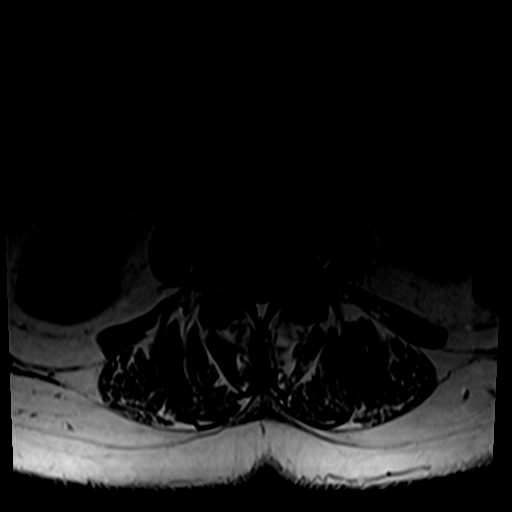
[im 28/34]
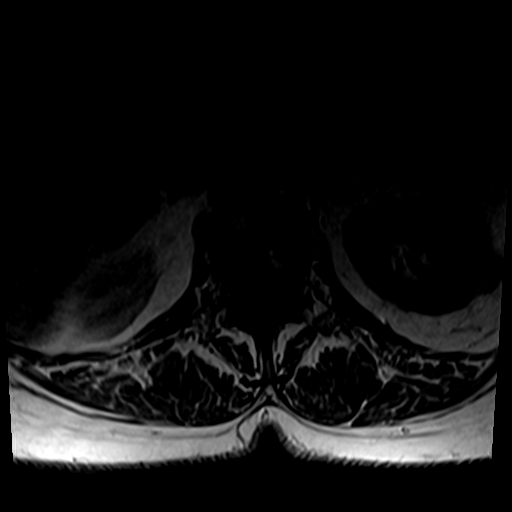

[25 of 48 positions shown; findings below may reference images not displayed]

FINDINGS: Segmentation:  5 lumbar type vertebral bodies

Alignment: Facet mediated grade 1 anterolisthesis at T12-L1, L3-4,
and L4-5.

Vertebrae:  No fracture, evidence of discitis, or bone lesion.

Conus medullaris and cauda equina: Conus extends to the L1-2 level.
Conus and cauda equina appear normal.

Paraspinal and other soft tissues: Negative

Disc levels:

T11-12: Advanced disc narrowing with facet spurring. No visible
impingement.

T12- L1: Disc narrowing with a shallow protrusion on sagittal
images. Grade 1 anterolisthesis. No impingement

L1-L2: Unremarkable.

L2-L3: Unremarkable.

L3-L4: Facet degeneration with spurring and anterolisthesis. The
disc is narrowed and bulging. Subarticular recess narrowing without
static compression

L4-L5: Degenerative facet spurring with anterolisthesis and gaping
fluid-filled joints. The disc is narrowed and bulging.
Noncompressive bilateral subarticular recess narrowing.

L5-S1:Greatest level of degenerative disc narrowing. Negative
facets. No impingement
IMPRESSION: 1. Mild progression of T12-L1 disc degeneration and anterolisthesis.
Otherwise stable from 1401.
2. L3-4 and L4-5 facet arthropathy with anterolisthesis.
3. Noncompressive subarticular recess narrowing at L3-4 and L4-5.

## 2020-03-16 DIAGNOSIS — I10 Essential (primary) hypertension: Secondary | ICD-10-CM | POA: Diagnosis not present

## 2020-03-16 DIAGNOSIS — E039 Hypothyroidism, unspecified: Secondary | ICD-10-CM | POA: Diagnosis not present

## 2020-03-23 DIAGNOSIS — E039 Hypothyroidism, unspecified: Secondary | ICD-10-CM | POA: Diagnosis not present

## 2020-03-23 DIAGNOSIS — I10 Essential (primary) hypertension: Secondary | ICD-10-CM | POA: Diagnosis not present

## 2020-03-23 DIAGNOSIS — M48062 Spinal stenosis, lumbar region with neurogenic claudication: Secondary | ICD-10-CM | POA: Diagnosis not present

## 2020-03-23 DIAGNOSIS — E871 Hypo-osmolality and hyponatremia: Secondary | ICD-10-CM | POA: Diagnosis not present

## 2020-03-23 DIAGNOSIS — F418 Other specified anxiety disorders: Secondary | ICD-10-CM | POA: Diagnosis not present

## 2020-03-23 DIAGNOSIS — M5416 Radiculopathy, lumbar region: Secondary | ICD-10-CM | POA: Diagnosis not present

## 2020-03-23 DIAGNOSIS — M5136 Other intervertebral disc degeneration, lumbar region: Secondary | ICD-10-CM | POA: Diagnosis not present

## 2020-03-23 DIAGNOSIS — J4520 Mild intermittent asthma, uncomplicated: Secondary | ICD-10-CM | POA: Diagnosis not present

## 2020-03-23 DIAGNOSIS — Z136 Encounter for screening for cardiovascular disorders: Secondary | ICD-10-CM | POA: Diagnosis not present

## 2020-09-22 DIAGNOSIS — Z136 Encounter for screening for cardiovascular disorders: Secondary | ICD-10-CM | POA: Diagnosis not present

## 2020-09-22 DIAGNOSIS — I10 Essential (primary) hypertension: Secondary | ICD-10-CM | POA: Diagnosis not present

## 2020-09-22 DIAGNOSIS — E871 Hypo-osmolality and hyponatremia: Secondary | ICD-10-CM | POA: Diagnosis not present

## 2020-09-22 DIAGNOSIS — E039 Hypothyroidism, unspecified: Secondary | ICD-10-CM | POA: Diagnosis not present

## 2020-09-28 DIAGNOSIS — M5416 Radiculopathy, lumbar region: Secondary | ICD-10-CM | POA: Diagnosis not present

## 2020-09-28 DIAGNOSIS — E785 Hyperlipidemia, unspecified: Secondary | ICD-10-CM | POA: Diagnosis not present

## 2020-09-28 DIAGNOSIS — I10 Essential (primary) hypertension: Secondary | ICD-10-CM | POA: Diagnosis not present

## 2020-09-28 DIAGNOSIS — M48062 Spinal stenosis, lumbar region with neurogenic claudication: Secondary | ICD-10-CM | POA: Diagnosis not present

## 2020-09-28 DIAGNOSIS — M5136 Other intervertebral disc degeneration, lumbar region: Secondary | ICD-10-CM | POA: Diagnosis not present

## 2020-09-28 DIAGNOSIS — Z0000 Encounter for general adult medical examination without abnormal findings: Secondary | ICD-10-CM | POA: Diagnosis not present

## 2020-09-28 DIAGNOSIS — E039 Hypothyroidism, unspecified: Secondary | ICD-10-CM | POA: Diagnosis not present

## 2020-11-27 DIAGNOSIS — E039 Hypothyroidism, unspecified: Secondary | ICD-10-CM | POA: Diagnosis not present

## 2021-03-24 DIAGNOSIS — E785 Hyperlipidemia, unspecified: Secondary | ICD-10-CM | POA: Diagnosis not present

## 2021-03-29 DIAGNOSIS — E785 Hyperlipidemia, unspecified: Secondary | ICD-10-CM | POA: Diagnosis not present

## 2021-03-29 DIAGNOSIS — M5416 Radiculopathy, lumbar region: Secondary | ICD-10-CM | POA: Diagnosis not present

## 2021-03-29 DIAGNOSIS — M5136 Other intervertebral disc degeneration, lumbar region: Secondary | ICD-10-CM | POA: Diagnosis not present

## 2021-03-29 DIAGNOSIS — F418 Other specified anxiety disorders: Secondary | ICD-10-CM | POA: Diagnosis not present

## 2021-03-29 DIAGNOSIS — E039 Hypothyroidism, unspecified: Secondary | ICD-10-CM | POA: Diagnosis not present

## 2021-03-29 DIAGNOSIS — M48062 Spinal stenosis, lumbar region with neurogenic claudication: Secondary | ICD-10-CM | POA: Diagnosis not present

## 2021-03-29 DIAGNOSIS — I10 Essential (primary) hypertension: Secondary | ICD-10-CM | POA: Diagnosis not present

## 2021-05-10 ENCOUNTER — Ambulatory Visit: Payer: PPO | Admitting: Dermatology

## 2021-05-10 DIAGNOSIS — L578 Other skin changes due to chronic exposure to nonionizing radiation: Secondary | ICD-10-CM

## 2021-05-10 DIAGNOSIS — L820 Inflamed seborrheic keratosis: Secondary | ICD-10-CM

## 2021-05-10 DIAGNOSIS — L821 Other seborrheic keratosis: Secondary | ICD-10-CM

## 2021-05-10 DIAGNOSIS — L570 Actinic keratosis: Secondary | ICD-10-CM | POA: Diagnosis not present

## 2021-05-10 NOTE — Progress Notes (Signed)
? ?New Patient Visit ? ?Subjective  ?Kristine Woods is a 81 y.o. female who presents for the following: New Patient (Initial Visit) (Patient reports a spot at nose and some spots at face. ). ?The patient has spots, moles and lesions to be evaluated, some may be new or changing and the patient has concerns that these could be cancer. ? ?The following portions of the chart were reviewed this encounter and updated as appropriate:  ? Tobacco  Allergies  Meds  Problems  Med Hx  Surg Hx  Fam Hx   ?  ?Review of Systems:  No other skin or systemic complaints except as noted in HPI or Assessment and Plan. ? ?Objective  ?Well appearing patient in no apparent distress; mood and affect are within normal limits. ? ?A focused examination was performed including face, nose, arms, hands, fingers. Relevant physical exam findings are noted in the Assessment and Plan. ? ?b/l hands and fingers and arms x 25 (25) ?Erythematous stuck-on, waxy papule or plaque ? ?dorsum distal nose x 1, left nasal ala x 1, right inferior cheek x 1 (3) ?Erythematous thin papules/macules with gritty scale.  ? ? ?Assessment & Plan  ?Inflamed seborrheic keratosis (25) ?b/l hands and fingers and arms x 25 ? ?Irritated and bothering patient  ? ?Destruction of lesion - b/l hands and fingers and arms x 25 ?Complexity: simple   ?Destruction method: cryotherapy   ?Informed consent: discussed and consent obtained   ?Timeout:  patient name, date of birth, surgical site, and procedure verified ?Lesion destroyed using liquid nitrogen: Yes   ?Region frozen until ice ball extended beyond lesion: Yes   ?Outcome: patient tolerated procedure well with no complications   ?Post-procedure details: wound care instructions given   ? ?Actinic keratosis (3) ?dorsum distal nose x 1, left nasal ala x 1, right inferior cheek x 1 ? ?Actinic keratoses are precancerous spots that appear secondary to cumulative UV radiation exposure/sun exposure over time. They are chronic with  expected duration over 1 year. A portion of actinic keratoses will progress to squamous cell carcinoma of the skin. It is not possible to reliably predict which spots will progress to skin cancer and so treatment is recommended to prevent development of skin cancer. ? ?Recommend daily broad spectrum sunscreen SPF 30+ to sun-exposed areas, reapply every 2 hours as needed.  ?Recommend staying in the shade or wearing long sleeves, sun glasses (UVA+UVB protection) and wide brim hats (4-inch brim around the entire circumference of the hat). ?Call for new or changing lesions. ? ?Destruction of lesion - dorsum distal nose x 1, left nasal ala x 1, right inferior cheek x 1 ?Complexity: simple   ?Destruction method: cryotherapy   ?Informed consent: discussed and consent obtained   ?Timeout:  patient name, date of birth, surgical site, and procedure verified ?Lesion destroyed using liquid nitrogen: Yes   ?Region frozen until ice ball extended beyond lesion: Yes   ?Outcome: patient tolerated procedure well with no complications   ?Post-procedure details: wound care instructions given   ?Additional details:  Prior to procedure, discussed risks of blister formation, small wound, skin dyspigmentation, or rare scar following cryotherapy. Recommend Vaseline ointment to treated areas while healing. ? ? ?Seborrheic Keratoses ?- Stuck-on, waxy, tan-brown papules and/or plaques  ?- Benign-appearing ?- Discussed benign etiology and prognosis. ?- Observe ?- Call for any changes ? ?Actinic Damage ?- chronic, secondary to cumulative UV radiation exposure/sun exposure over time ?- diffuse scaly erythematous macules with underlying dyspigmentation ?-  Recommend daily broad spectrum sunscreen SPF 30+ to sun-exposed areas, reapply every 2 hours as needed.  ?- Recommend staying in the shade or wearing long sleeves, sun glasses (UVA+UVB protection) and wide brim hats (4-inch brim around the entire circumference of the hat). ?- Call for new or  changing lesions. ? ?Return for 3 - 4 month ak, isk tbse . ? ?I, Ruthell Rummage, CMA, am acting as scribe for Sarina Ser, MD. ?Documentation: I have reviewed the above documentation for accuracy and completeness, and I agree with the above. ? ?Sarina Ser, MD ? ? ?

## 2021-05-10 NOTE — Patient Instructions (Addendum)
?Seborrheic Keratosis ? ?What causes seborrheic keratoses? ?Seborrheic keratoses are harmless, common skin growths that first appear during adult life.  As time goes by, more growths appear.  Some people may develop a large number of them.  Seborrheic keratoses appear on both covered and uncovered body parts.  They are not caused by sunlight.  The tendency to develop seborrheic keratoses can be inherited.  They vary in color from skin-colored to gray, brown, or even black.  They can be either smooth or have a rough, warty surface.   ?Seborrheic keratoses are superficial and look as if they were stuck on the skin.  Under the microscope this type of keratosis looks like layers upon layers of skin.  That is why at times the top layer may seem to fall off, but the rest of the growth remains and re-grows.   ? ?Treatment ?Seborrheic keratoses do not need to be treated, but can easily be removed in the office.  Seborrheic keratoses often cause symptoms when they rub on clothing or jewelry.  Lesions can be in the way of shaving.  If they become inflamed, they can cause itching, soreness, or burning.  Removal of a seborrheic keratosis can be accomplished by freezing, burning, or surgery. ?If any spot bleeds, scabs, or grows rapidly, please return to have it checked, as these can be an indication of a skin cancer. ? ?Cryotherapy Aftercare ? ?Wash gently with soap and water everyday.   ?Apply Vaseline and Band-Aid daily until healed.  ? ? ?Actinic keratoses are precancerous spots that appear secondary to cumulative UV radiation exposure/sun exposure over time. They are chronic with expected duration over 1 year. A portion of actinic keratoses will progress to squamous cell carcinoma of the skin. It is not possible to reliably predict which spots will progress to skin cancer and so treatment is recommended to prevent development of skin cancer. ? ?Recommend daily broad spectrum sunscreen SPF 30+ to sun-exposed areas, reapply  every 2 hours as needed.  ?Recommend staying in the shade or wearing long sleeves, sun glasses (UVA+UVB protection) and wide brim hats (4-inch brim around the entire circumference of the hat). ?Call for new or changing lesions.  ? ? ? ?If You Need Anything After Your Visit ? ?If you have any questions or concerns for your doctor, please call our main line at [phone] and press option 4 to reach your doctor's medical assistant. If no one answers, please leave a voicemail as directed and we will return your call as soon as possible. Messages left after 4 pm will be answered the following business day.  ? ?You may also send [place] a message via MyChart. We typically respond to MyChart messages within 1-2 business days. ? ?For prescription refills, please ask your pharmacy to contact our office. Our fax number is [phone]. ? ?If you have an urgent issue when the clinic is closed that cannot wait until the next business day, you can page your doctor at the number below.   ? ?Please note that while we do our best to be available for urgent issues outside of office hours, we are not available 24/7.  ? ?If you have an urgent issue and are unable to reach [place], you may choose to seek medical care at your doctor's office, retail clinic, urgent care center, or emergency room. ? ?If you have a medical emergency, please immediately call 911 or go to the emergency department. ? ?Pager Numbers ? ?- Dr. Nehemiah Massed: [phone] ? ?- Dr.  Moye: [phone] ? ?- Dr. Nicole Kindred: [phone] ? ?In the event of inclement weather, please call our main line at [phone] for an update on the status of any delays or closures. ? ?Dermatology Medication Tips: ?Please keep the boxes that topical medications come in in order to help keep track of the instructions about where and how to use these. Pharmacies typically print the medication instructions only on the boxes and not directly on the medication tubes.  ? ?If your medication is too  expensive, please contact our office at [phone] option 4 or send [place] a message through [place].  ? ?We are unable to tell what your co-pay for medications will be in advance as this is different depending on your insurance coverage. However, we may be able to find a substitute medication at lower cost or fill out paperwork to get insurance to cover a needed medication.  ? ?If a prior authorization is required to get your medication covered by your insurance company, please allow [place] 1-2 business days to complete this process. ? ?Drug prices often vary depending on where the prescription is filled and some pharmacies may offer cheaper prices. ? ?The website www.goodrx.com contains coupons for medications through different pharmacies. The prices here do not account for what the cost may be with help from insurance (it may be cheaper with your insurance), but the website can give you the price if you did not use any insurance.  ?- You can print the associated coupon and take it with your prescription to the pharmacy.  ?- You may also stop by our office during regular business hours and pick up a GoodRx coupon card.  ?- If you need your prescription sent electronically to a different pharmacy, notify our office through [facility] or by phone at [phone] option 4. ? ? ? ? ?Si Usted Necesita Algo Despu?s de Su Visita ? ?Tambi?n puede enviarnos un mensaje a trav?s de MyChart. Por lo general respondemos a los mensajes de MyChart en el transcurso de 1 a 2 d?as h?biles. ? ?Para renovar recetas, por favor pida a su farmacia que se ponga en contacto con nuestra oficina. Nuestro n?mero de fax es el [phone]. ? ?Si tiene un asunto urgente cuando la cl?nica est? cerrada y que no puede esperar hasta el siguiente d?a h?bil, puede llamar/localizar a su doctor(a) al n?mero que aparece a continuaci?n.  ? ?Por favor, tenga en cuenta que aunque hacemos todo lo posible para estar disponibles para asuntos urgentes fuera  del horario de oficina, no estamos disponibles las 24 horas del d?a, los 7 d?as de la semana.  ? ?Si tiene un problema urgente y no puede comunicarse con nosotros, puede optar por buscar atenci?n m?dica  en el consultorio de su doctor(a), en una cl?nica privada, en un centro de atenci?n urgente o en una sala de emergencias. ? ?Si tiene Engineer, maintenance (IT) m?dica, por favor llame inmediatamente al 911 o vaya a la sala de emergencias. ? ?N?meros de b?per ? ?- Dr. Nehemiah Massed: [phone] ? ?- Dra. Moye: [phone] ? ?- Dra. Nicole Kindred: [phone] ? ?En caso de inclemencias del tiempo, por favor llame a nuestra l?nea principal al [phone] para una actualizaci?n sobre el estado de cualquier retraso o cierre. ? ?Consejos para la medicaci?n en dermatolog?a: ?Por favor, guarde las cajas en las que vienen los medicamentos de uso t?pico para ayudarle a seguir las instrucciones sobre d?nde y c?mo usarlos. Las farmacias generalmente imprimen las instrucciones del medicamento s?lo en las cajas y no  directamente en los tubos del medicamento.  ? ?Si su medicamento es muy caro, por favor, p?ngase en contacto con Zigmund Daniel llamando al [phone] y presione la opci?n 4 o env?enos un mensaje a trav?s de MyChart.  ? ?No podemos decirle cu?l ser? su copago por los medicamentos por adelantado ya que esto es diferente dependiendo de la cobertura de su seguro. Sin embargo, es posible que podamos encontrar un medicamento sustituto a Electrical engineer un formulario para que el seguro cubra el medicamento que se considera necesario.  ? ?Si se requiere Ardelia Mems autorizaci?n previa para que su compa??a de seguros [place] su medicamento, por favor perm?tanos de 1 a 2 d?as h?biles para completar este proceso. ? ?Los precios de los medicamentos var?an con frecuencia dependiendo del Environmental consultant de d?nde se surte la receta y alguna farmacias pueden ofrecer precios m?s baratos. ? ?El sitio web www.goodrx.com tiene cupones para medicamentos de Office manager. Los precios aqu? no tienen en cuenta lo que podr?a costar con la ayuda del seguro (puede ser m?s barato con su seguro), pero el sitio web puede darle el precio si no utiliz? ning?n seguro.  ?-

## 2021-05-18 ENCOUNTER — Encounter: Payer: Self-pay | Admitting: Dermatology

## 2021-07-26 ENCOUNTER — Encounter: Payer: Self-pay | Admitting: Dermatology

## 2021-07-26 ENCOUNTER — Ambulatory Visit: Payer: PPO | Admitting: Dermatology

## 2021-07-26 DIAGNOSIS — L738 Other specified follicular disorders: Secondary | ICD-10-CM | POA: Diagnosis not present

## 2021-07-26 DIAGNOSIS — L820 Inflamed seborrheic keratosis: Secondary | ICD-10-CM

## 2021-07-26 DIAGNOSIS — L578 Other skin changes due to chronic exposure to nonionizing radiation: Secondary | ICD-10-CM

## 2021-07-26 DIAGNOSIS — L570 Actinic keratosis: Secondary | ICD-10-CM

## 2021-07-26 DIAGNOSIS — D1800 Hemangioma unspecified site: Secondary | ICD-10-CM | POA: Diagnosis not present

## 2021-07-26 DIAGNOSIS — L814 Other melanin hyperpigmentation: Secondary | ICD-10-CM

## 2021-07-26 DIAGNOSIS — D229 Melanocytic nevi, unspecified: Secondary | ICD-10-CM

## 2021-07-26 DIAGNOSIS — L821 Other seborrheic keratosis: Secondary | ICD-10-CM | POA: Diagnosis not present

## 2021-07-26 DIAGNOSIS — L304 Erythema intertrigo: Secondary | ICD-10-CM | POA: Diagnosis not present

## 2021-07-26 DIAGNOSIS — Z85828 Personal history of other malignant neoplasm of skin: Secondary | ICD-10-CM | POA: Diagnosis not present

## 2021-07-26 DIAGNOSIS — Z1283 Encounter for screening for malignant neoplasm of skin: Secondary | ICD-10-CM | POA: Diagnosis not present

## 2021-07-26 MED ORDER — KETOCONAZOLE 2 % EX CREA: TOPICAL_CREAM | CUTANEOUS | 6 refills | Status: DC

## 2021-07-26 MED ORDER — KETOCONAZOLE 2 % EX CREA: TOPICAL_CREAM | CUTANEOUS | 6 refills | Status: AC

## 2021-07-26 NOTE — Patient Instructions (Signed)
Due to recent changes in healthcare laws, you may see results of your pathology and/or laboratory studies on MyChart before the doctors have had a chance to review them. We understand that in some cases there may be results that are confusing or concerning to you. Please understand that not all results are received at the same time and often the doctors may need to interpret multiple results in order to provide you with the best plan of care or course of treatment. Therefore, we ask that you please give us 2 business days to thoroughly review all your results before contacting the office for clarification. Should we see a critical lab result, you will be contacted sooner.   If You Need Anything After Your Visit  If you have any questions or concerns for your doctor, please call our main line at [phone] and press option 4 to reach your doctor's medical assistant. If no one answers, please leave a voicemail as directed and we will return your call as soon as possible. Messages left after 4 pm will be answered the following business day.   You may also send us a message via MyChart. We typically respond to MyChart messages within 1-2 business days.  For prescription refills, please ask your pharmacy to contact our office. Our fax number is [phone].  If you have an urgent issue when the clinic is closed that cannot wait until the next business day, you can page your doctor at the number below.    Please note that while we do our best to be available for urgent issues outside of office hours, we are not available 24/7.   If you have an urgent issue and are unable to reach us, you may choose to seek medical care at your doctor's office, retail clinic, urgent care center, or emergency room.  If you have a medical emergency, please immediately call 911 or go to the emergency department.  Pager Numbers  - Dr. Kowalski: [phone]  - Dr. Moye: [phone]  - Dr. Stewart:  [phone]  In the event of inclement weather, please call our main line at [phone] for an update on the status of any delays or closures.  Dermatology Medication Tips: Please keep the boxes that topical medications come in in order to help keep track of the instructions about where and how to use these. Pharmacies typically print the medication instructions only on the boxes and not directly on the medication tubes.   If your medication is too expensive, please contact our office at [phone] option 4 or send us a message through MyChart.   We are unable to tell what your co-pay for medications will be in advance as this is different depending on your insurance coverage. However, we may be able to find a substitute medication at lower cost or fill out paperwork to get insurance to cover a needed medication.   If a prior authorization is required to get your medication covered by your insurance company, please allow us 1-2 business days to complete this process.  Drug prices often vary depending on where the prescription is filled and some pharmacies may offer cheaper prices.  The website www.goodrx.com contains coupons for medications through different pharmacies. The prices here do not account for what the cost may be with help from insurance (it may be cheaper with your insurance), but the website can give you the price if you did not use any insurance.  - You can print the associated coupon and take it with   your prescription to the pharmacy.  - You may also stop by our office during regular business hours and pick up a GoodRx coupon card.  - If you need your prescription sent electronically to a different pharmacy, notify our office through [facility] MyChart or by phone at [phone] option 4.     Si Usted Necesita Algo Despus de Su Visita  Tambin puede enviarnos un mensaje a travs de MyChart. Por lo general respondemos a los mensajes de MyChart en el transcurso de 1 a 2  das hbiles.  Para renovar recetas, por favor pida a su farmacia que se ponga en contacto con nuestra oficina. Nuestro nmero de fax es el [phone].  Si tiene un asunto urgente cuando la clnica est cerrada y que no puede esperar hasta el siguiente da hbil, puede llamar/localizar a su doctor(a) al nmero que aparece a continuacin.   Por favor, tenga en cuenta que aunque hacemos todo lo posible para estar disponibles para asuntos urgentes fuera del horario de oficina, no estamos disponibles las 24 horas del da, los 7 das de la semana.   Si tiene un problema urgente y no puede comunicarse con nosotros, puede optar por buscar atencin mdica  en el consultorio de su doctor(a), en una clnica privada, en un centro de atencin urgente o en una sala de emergencias.  Si tiene una emergencia mdica, por favor llame inmediatamente al 911 o vaya a la sala de emergencias.  Nmeros de bper  - Dr. Kowalski: [phone]  - Dra. Moye: [phone]  - Dra. Stewart: [phone]  En caso de inclemencias del tiempo, por favor llame a nuestra lnea principal al [phone] para una actualizacin sobre el estado de cualquier retraso o cierre.  Consejos para la medicacin en dermatologa: Por favor, guarde las cajas en las que vienen los medicamentos de uso tpico para ayudarle a seguir las instrucciones sobre dnde y cmo usarlos. Las farmacias generalmente imprimen las instrucciones del medicamento slo en las cajas y no directamente en los tubos del medicamento.   Si su medicamento es muy caro, por favor, pngase en contacto con nuestra oficina llamando al [phone] y presione la opcin 4 o envenos un mensaje a travs de MyChart.   No podemos decirle cul ser su copago por los medicamentos por adelantado ya que esto es diferente dependiendo de la cobertura de su seguro. Sin embargo, es posible que podamos encontrar un medicamento sustituto a menor costo o llenar un formulario para que el  seguro cubra el medicamento que se considera necesario.   Si se requiere una autorizacin previa para que su compaa de seguros cubra su medicamento, por favor permtanos de 1 a 2 das hbiles para completar este proceso.  Los precios de los medicamentos varan con frecuencia dependiendo del lugar de dnde se surte la receta y alguna farmacias pueden ofrecer precios ms baratos.  El sitio web www.goodrx.com tiene cupones para medicamentos de diferentes farmacias. Los precios aqu no tienen en cuenta lo que podra costar con la ayuda del seguro (puede ser ms barato con su seguro), pero el sitio web puede darle el precio si no utiliz ningn seguro.  - Puede imprimir el cupn correspondiente y llevarlo con su receta a la farmacia.  - Tambin puede pasar por nuestra oficina durante el horario de atencin regular y recoger una tarjeta de cupones de GoodRx.  - Si necesita que su receta se enve electrnicamente a una farmacia diferente, informe a nuestra oficina a travs de MyChart de [facility]   o por telfono llamando al [phone] y presione la opcin 4.  

## 2021-07-26 NOTE — Progress Notes (Signed)
Follow-Up Visit   Subjective  Kristine Woods is a 81 y.o. female who presents for the following: Annual Exam (Hx skin cancer on the nose per patient, AK's, ISK's ). The patient presents for Total-Body Skin Exam (TBSE) for skin cancer screening and mole check.  The patient has spots, moles and lesions to be evaluated, some may be new or changing and the patient has concerns that these could be cancer.  The following portions of the chart were reviewed this encounter and updated as appropriate:   Tobacco  Allergies  Meds  Problems  Med Hx  Surg Hx  Fam Hx     Review of Systems:  No other skin or systemic complaints except as noted in HPI or Assessment and Plan.  Objective  Well appearing patient in no apparent distress; mood and affect are within normal limits.  A full examination was performed including scalp, head, eyes, ears, nose, lips, neck, chest, axillae, abdomen, back, buttocks, bilateral upper extremities, bilateral lower extremities, hands, feet, fingers, toes, fingernails, and toenails. All findings within normal limits unless otherwise noted below.  Face x 2 (2) Erythematous thin papules/macules with gritty scale.   hands and arms x 16, R deltoid x 2 (18) Erythematous stuck-on, waxy papule or plaque   Assessment & Plan  AK (actinic keratosis) (2) Face x 2  Destruction of lesion - Face x 2 Complexity: simple   Destruction method: cryotherapy   Informed consent: discussed and consent obtained   Timeout:  patient name, date of birth, surgical site, and procedure verified Lesion destroyed using liquid nitrogen: Yes   Region frozen until ice ball extended beyond lesion: Yes   Outcome: patient tolerated procedure well with no complications   Post-procedure details: wound care instructions given    Inflamed seborrheic keratosis (18) hands and arms x 16, R deltoid x 2  Destruction of lesion - hands and arms x 16, R deltoid x 2 Complexity: simple   Destruction  method: cryotherapy   Informed consent: discussed and consent obtained   Timeout:  patient name, date of birth, surgical site, and procedure verified Lesion destroyed using liquid nitrogen: Yes   Region frozen until ice ball extended beyond lesion: Yes   Outcome: patient tolerated procedure well with no complications   Post-procedure details: wound care instructions given    Erythema intertrigo Inframammary  Intertrigo is a chronic recurrent rash that occurs in skin fold areas that may be associated with friction; heat; moisture; yeast; fungus; and bacteria.  It is exacerbated by increased movement / activity; sweating; and higher atmospheric temperature.  Start Ketoconazole 2% cream to aa's QD. Ok to continue HC cream QD PRN.   Related Medications ketoconazole (NIZORAL) 2 % cream Apply to rash under breast QD.  Skin cancer screening  Lentigines - Scattered tan macules - Due to sun exposure - Benign-appearing, observe - Recommend daily broad spectrum sunscreen SPF 30+ to sun-exposed areas, reapply every 2 hours as needed. - Call for any changes  Seborrheic Keratoses - Stuck-on, waxy, tan-brown papules and/or plaques  - Benign-appearing - Discussed benign etiology and prognosis. - Observe - Call for any changes  Melanocytic Nevi - Tan-brown and/or pink-flesh-colored symmetric macules and papules - Benign appearing on exam today - Observation - Call clinic for new or changing moles - Recommend daily use of broad spectrum spf 30+ sunscreen to sun-exposed areas.   Hemangiomas - Red papules - Discussed benign nature - Observe - Call for any changes  Actinic Damage -  Chronic condition, secondary to cumulative UV/sun exposure - diffuse scaly erythematous macules with underlying dyspigmentation - Recommend daily broad spectrum sunscreen SPF 30+ to sun-exposed areas, reapply every 2 hours as needed.  - Staying in the shade or wearing long sleeves, sun glasses (UVA+UVB  protection) and wide brim hats (4-inch brim around the entire circumference of the hat) are also recommended for sun protection.  - Call for new or changing lesions.  Sebaceous Hyperplasia - Small yellow papules with a central dell - Benign - Observe  Skin cancer screening performed today.  Return in about 1 year (around 07/27/2022) for TBSE.  Luther Redo, CMA, am acting as scribe for Sarina Ser, MD . Documentation: I have reviewed the above documentation for accuracy and completeness, and I agree with the above.  Sarina Ser, MD

## 2021-08-03 ENCOUNTER — Encounter: Payer: Self-pay | Admitting: Dermatology

## 2021-08-30 DIAGNOSIS — H31011 Macula scars of posterior pole (postinflammatory) (post-traumatic), right eye: Secondary | ICD-10-CM | POA: Diagnosis not present

## 2021-09-07 DIAGNOSIS — H353131 Nonexudative age-related macular degeneration, bilateral, early dry stage: Secondary | ICD-10-CM | POA: Diagnosis not present

## 2021-09-07 DIAGNOSIS — H2513 Age-related nuclear cataract, bilateral: Secondary | ICD-10-CM | POA: Diagnosis not present

## 2021-09-27 DIAGNOSIS — M5416 Radiculopathy, lumbar region: Secondary | ICD-10-CM | POA: Diagnosis not present

## 2021-09-27 DIAGNOSIS — I10 Essential (primary) hypertension: Secondary | ICD-10-CM | POA: Diagnosis not present

## 2021-09-27 DIAGNOSIS — E039 Hypothyroidism, unspecified: Secondary | ICD-10-CM | POA: Diagnosis not present

## 2021-09-27 DIAGNOSIS — M48062 Spinal stenosis, lumbar region with neurogenic claudication: Secondary | ICD-10-CM | POA: Diagnosis not present

## 2021-09-27 DIAGNOSIS — M5136 Other intervertebral disc degeneration, lumbar region: Secondary | ICD-10-CM | POA: Diagnosis not present

## 2021-09-27 DIAGNOSIS — E785 Hyperlipidemia, unspecified: Secondary | ICD-10-CM | POA: Diagnosis not present

## 2021-10-04 DIAGNOSIS — Z0000 Encounter for general adult medical examination without abnormal findings: Secondary | ICD-10-CM | POA: Diagnosis not present

## 2021-10-04 DIAGNOSIS — E785 Hyperlipidemia, unspecified: Secondary | ICD-10-CM | POA: Diagnosis not present

## 2021-10-04 DIAGNOSIS — E039 Hypothyroidism, unspecified: Secondary | ICD-10-CM | POA: Diagnosis not present

## 2021-10-04 DIAGNOSIS — I10 Essential (primary) hypertension: Secondary | ICD-10-CM | POA: Diagnosis not present

## 2021-10-11 DIAGNOSIS — H2511 Age-related nuclear cataract, right eye: Secondary | ICD-10-CM | POA: Diagnosis not present

## 2021-10-20 ENCOUNTER — Encounter: Payer: Self-pay | Admitting: Ophthalmology

## 2021-10-25 NOTE — Discharge Instructions (Signed)

## 2021-10-27 ENCOUNTER — Ambulatory Visit
Admission: RE | Admit: 2021-10-27 | Discharge: 2021-10-27 | Disposition: A | Discharge status: Home / Self Care | Payer: PPO | Attending: Ophthalmology | Admitting: Ophthalmology

## 2021-10-27 ENCOUNTER — Encounter: Payer: Self-pay | Admitting: Ophthalmology

## 2021-10-27 ENCOUNTER — Ambulatory Visit: Payer: PPO | Admitting: General Practice

## 2021-10-27 ENCOUNTER — Other Ambulatory Visit: Payer: Self-pay

## 2021-10-27 ENCOUNTER — Encounter
Admission: RE | Disposition: A | Discharge status: Home / Self Care | Payer: Self-pay | Source: Home / Self Care | Attending: Ophthalmology

## 2021-10-27 DIAGNOSIS — E039 Hypothyroidism, unspecified: Secondary | ICD-10-CM | POA: Diagnosis not present

## 2021-10-27 DIAGNOSIS — H2511 Age-related nuclear cataract, right eye: Secondary | ICD-10-CM | POA: Diagnosis not present

## 2021-10-27 DIAGNOSIS — I10 Essential (primary) hypertension: Secondary | ICD-10-CM | POA: Insufficient documentation

## 2021-10-27 DIAGNOSIS — Z853 Personal history of malignant neoplasm of breast: Secondary | ICD-10-CM | POA: Insufficient documentation

## 2021-10-27 DIAGNOSIS — K219 Gastro-esophageal reflux disease without esophagitis: Secondary | ICD-10-CM | POA: Insufficient documentation

## 2021-10-27 DIAGNOSIS — Z85828 Personal history of other malignant neoplasm of skin: Secondary | ICD-10-CM | POA: Insufficient documentation

## 2021-10-27 DIAGNOSIS — Z90710 Acquired absence of both cervix and uterus: Secondary | ICD-10-CM | POA: Diagnosis not present

## 2021-10-27 DIAGNOSIS — Z9012 Acquired absence of left breast and nipple: Secondary | ICD-10-CM | POA: Insufficient documentation

## 2021-10-27 DIAGNOSIS — Z9049 Acquired absence of other specified parts of digestive tract: Secondary | ICD-10-CM | POA: Diagnosis not present

## 2021-10-27 DIAGNOSIS — J45990 Exercise induced bronchospasm: Secondary | ICD-10-CM | POA: Insufficient documentation

## 2021-10-27 HISTORY — PX: CATARACT EXTRACTION W/PHACO: SHX586

## 2021-10-27 HISTORY — DX: Other complications of anesthesia, initial encounter: T88.59XA

## 2021-10-27 HISTORY — DX: Sciatica, left side: M54.32

## 2021-10-27 SURGERY — PHACOEMULSIFICATION, CATARACT, WITH IOL INSERTION
Anesthesia: Monitor Anesthesia Care | Site: Eye | Laterality: Right

## 2021-10-27 MED ORDER — BRIMONIDINE TARTRATE-TIMOLOL 0.2-0.5 % OP SOLN
OPHTHALMIC | Status: DC | PRN
  Administered 2021-10-27: 1 [drp] via OPHTHALMIC

## 2021-10-27 MED ORDER — TETRACAINE HCL 0.5 % OP SOLN
1.0000 [drp] | OPHTHALMIC | Status: DC | PRN
  Administered 2021-10-27 (×3): 1 [drp] via OPHTHALMIC

## 2021-10-27 MED ORDER — ARMC OPHTHALMIC DILATING DROPS
1.0000 | OPHTHALMIC | Status: DC | PRN
  Administered 2021-10-27 (×3): 1 via OPHTHALMIC

## 2021-10-27 MED ORDER — SIGHTPATH DOSE#1 BSS IO SOLN
INTRAOCULAR | Status: DC | PRN
  Administered 2021-10-27: 2 mL

## 2021-10-27 MED ORDER — SIGHTPATH DOSE#1 NA HYALUR & NA CHOND-NA HYALUR IO KIT
PACK | INTRAOCULAR | Status: DC | PRN
  Administered 2021-10-27: 1 via OPHTHALMIC

## 2021-10-27 MED ORDER — SIGHTPATH DOSE#1 BSS IO SOLN
INTRAOCULAR | Status: DC | PRN
  Administered 2021-10-27: 70 mL via OPHTHALMIC

## 2021-10-27 MED ORDER — MOXIFLOXACIN HCL 0.5 % OP SOLN
OPHTHALMIC | Status: DC | PRN
  Administered 2021-10-27: 0.2 mL via OPHTHALMIC

## 2021-10-27 MED ORDER — MIDAZOLAM HCL 2 MG/2ML IJ SOLN
INTRAMUSCULAR | Status: DC | PRN
  Administered 2021-10-27 (×2): 1 mg via INTRAVENOUS

## 2021-10-27 MED ORDER — FENTANYL CITRATE (PF) 100 MCG/2ML IJ SOLN
INTRAMUSCULAR | Status: DC | PRN
  Administered 2021-10-27 (×2): 25 ug via INTRAVENOUS
  Administered 2021-10-27: 50 ug via INTRAVENOUS

## 2021-10-27 MED ORDER — SIGHTPATH DOSE#1 BSS IO SOLN
INTRAOCULAR | Status: DC | PRN
  Administered 2021-10-27: 15 mL via INTRAOCULAR

## 2021-10-27 SURGICAL SUPPLY — 12 items
CANNULA ANT/CHMB 27G (MISCELLANEOUS) IMPLANT
CANNULA ANT/CHMB 27GA (MISCELLANEOUS) IMPLANT
CATARACT SUITE SIGHTPATH (MISCELLANEOUS) ×1 IMPLANT
FEE CATARACT SUITE SIGHTPATH (MISCELLANEOUS) ×1 IMPLANT
GLOVE SRG 8 PF TXTR STRL LF DI (GLOVE) ×1 IMPLANT
GLOVE SURG ENC TEXT LTX SZ7.5 (GLOVE) ×1 IMPLANT
GLOVE SURG UNDER POLY LF SZ8 (GLOVE) ×1
LENS IOL TECNIS EYHANCE 24.5 (Intraocular Lens) IMPLANT
NDL FILTER BLUNT 18X1 1/2 (NEEDLE) ×1 IMPLANT
NEEDLE FILTER BLUNT 18X1 1/2 (NEEDLE) ×1 IMPLANT
SYR 3ML LL SCALE MARK (SYRINGE) ×1 IMPLANT
WATER STERILE IRR 250ML POUR (IV SOLUTION) ×1 IMPLANT

## 2021-10-27 NOTE — H&P (Signed)
[facility]   Primary Care Physician:  Dion Body, MD Ophthalmologist: Dr. Leandrew Koyanagi  Pre-Procedure History & Physical: HPI:  Kristine Woods is a 81 y.o. female here for ophthalmic surgery.   Past Medical History:  Diagnosis Date   Arthritis    Asthma    exercise and allergy induced   Breast cancer ([place]) 06/17/1993   lt lumpectomy/rad   Complication of anesthesia    Ether - As child "died on operating table"   Depression    GERD (gastroesophageal reflux disease)    Hypertension    Hypothyroidism    Personal history of radiation therapy    Sciatica of left side    Foot numbness   Skin cancer    Nose    Past Surgical History:  Procedure Laterality Date   ABDOMINAL HYSTERECTOMY     ABDOMINOPLASTY     BREAST BIOPSY Left 04/13/2015   intraductal papilloma-usbx/clip   BREAST BIOPSY Left 05/05/2015   NL- benign   BREAST LUMPECTOMY Left 1995   positive   BREAST LUMPECTOMY WITH NEEDLE LOCALIZATION Left 05/05/2015   Procedure: BREAST LUMPECTOMY WITH NEEDLE LOCALIZATION;  Surgeon: Leonie Green, MD;  Location: [facility] ORS;  Service: General;  Laterality: Left;   CHOLECYSTECTOMY     COLONOSCOPY W/ POLYPECTOMY     DG THUMB LEFT HAND     DG THUMB RIGHT HAND ([facility] HX)     ESOPHAGEAL DILATION     ESOPHAGOGASTRODUODENOSCOPY     KNEE ARTHROSCOPY Left    LIPOSUCTION     MASTECTOMY Left 1995   partial    THYROID SURGERY     adenoma    Prior to Admission medications   Medication Sig Start Date End Date Taking? Authorizing Provider  albuterol (PROVENTIL HFA;VENTOLIN HFA) 108 (90 Base) MCG/ACT inhaler Inhale 2 puffs into the lungs every 4 (four) hours as needed for wheezing or shortness of breath.   Yes [provider]  b complex vitamins capsule Take 1 capsule by mouth daily.   Yes [provider]  Calcium Carb-Cholecalciferol 600-10 MG-MCG TABS Take 2 tablets by mouth daily.   Yes [provider]  enalapril (VASOTEC) 20 MG  tablet Take 20 mg by mouth 2 (two) times daily. 03/01/21  Yes [provider]  FIBER PO Take 2 tablets by mouth daily.   Yes [provider]  gabapentin (NEURONTIN) 300 MG capsule Take 1 qAM, 1 qLunch, 2 qHS 03/29/21  Yes [provider]  Glucosamine Sulfate 1000 MG CAPS Take 1 capsule by mouth every morning.   Yes [provider]  hydrochlorothiazide (HYDRODIURIL) 25 MG tablet Take 25 mg by mouth daily. 03/01/21  Yes [provider]  ibuprofen (ADVIL,MOTRIN) 200 MG tablet Take 400 mg by mouth 3 (three) times daily.   Yes [provider]  levothyroxine (SYNTHROID) 50 MCG tablet Take 50 mcg by mouth daily. 11/30/20  Yes [provider]  loratadine (CLARITIN) 10 MG tablet Take 10 mg by mouth daily.   Yes [provider]  Multiple Vitamins-Minerals (PRESERVISION AREDS) CAPS Take 2 capsules by mouth daily.   Yes [provider]  omeprazole (PRILOSEC) 20 MG capsule Take 20 mg by mouth daily.   Yes [provider]  psyllium (KONSYL) 33 % POWD Take by mouth.   Yes [provider]  traMADol (ULTRAM) 50 MG tablet Take 50 mg by mouth every 6 (six) hours as needed. 04/27/21  Yes [provider]  venlafaxine (EFFEXOR) 75 MG tablet Take  75 mg by mouth 2 (two) times daily.   Yes [provider]  ketoconazole (NIZORAL) 2 % cream Apply to rash under breast QD. 07/26/21   Ralene Bathe, MD    Allergies as of 09/08/2021 - Review Complete 08/03/2021  Allergen Reaction Noted   Erythromycin Other (See Comments) 04/30/2015   Other  04/30/2015   Penicillins Rash 04/30/2015   Sulfa antibiotics Rash 04/30/2015    Family History  Problem Relation Age of Onset   Breast cancer Neg Hx     Social History   Socioeconomic History   Marital status: Married    Spouse name: Not on file   Number of children: Not on file   Years of education: Not on file   Highest education level: Not on file   Occupational History   Not on file  Tobacco Use   Smoking status: Never   Smokeless tobacco: Never  Vaping Use   Vaping Use: Never used  Substance and Sexual Activity   Alcohol use: Not Currently   Drug use: No   Sexual activity: Yes    Comment: rarely  Other Topics Concern   Not on file  Social History Narrative   Not on file   Social Determinants of Health   Financial Resource Strain: Not on file  Food Insecurity: Not on file  Transportation Needs: Not on file  Physical Activity: Not on file  Stress: Not on file  Social Connections: Not on file  Intimate Partner Violence: Not on file    Review of Systems: See HPI, otherwise negative ROS  Physical Exam: BP (!) 181/93   Pulse 88   Temp 97.7 F (36.5 C) (Temporal)   Ht '5\' 2"'$  (1.575 m)   Wt 67.6 kg   SpO2 97%   BMI 27.25 kg/m  General:   Alert,  pleasant and cooperative in NAD Head:  Normocephalic and atraumatic. Lungs:  Clear to auscultation.    Heart:  Regular rate and rhythm.   Impression/Plan: Kristine Woods is here for ophthalmic surgery.  Risks, benefits, limitations, and alternatives regarding ophthalmic surgery have been reviewed with the patient.  Questions have been answered.  All parties agreeable.   Leandrew Koyanagi, MD  10/27/2021, 11:58 AM

## 2021-10-27 NOTE — Op Note (Signed)
LOCATION:  [facility]   PREOPERATIVE DIAGNOSIS:    Nuclear sclerotic cataract right eye. H25.11   POSTOPERATIVE DIAGNOSIS:  Nuclear sclerotic cataract right eye.     PROCEDURE:  Phacoemusification with posterior chamber intraocular lens placement of the right eye   ULTRASOUND TIME: Procedure(s): CATARACT EXTRACTION PHACO AND INTRAOCULAR LENS PLACEMENT (IOC) RIGHT 10.91 01:15.1 (Right)  LENS:   Implant Name Type Inv. Item Serial No. Manufacturer Lot No. LRB No. Used Action  LENS IOL TECNIS EYHANCE 24.5 - [id number] Intraocular Lens LENS IOL TECNIS EYHANCE 24.5 [id number] [employer]  Right 1 Implanted         SURGEON:  Wyonia Hough, MD   ANESTHESIA:  Topical with tetracaine drops and 2% Xylocaine jelly, augmented with 1% preservative-free intracameral lidocaine.    COMPLICATIONS:  None.   DESCRIPTION OF PROCEDURE:  The patient was identified in the holding room and transported to the operating room and placed in the supine position under the operating microscope.  The right eye was identified as the operative eye and it was prepped and draped in the usual sterile ophthalmic fashion.   A 1 millimeter clear-corneal paracentesis was made at the 1:00 position.  0.5 ml of preservative-free 1% lidocaine was injected into the anterior chamber. The anterior chamber was filled with Viscoat viscoelastic.  A 2.4 millimeter keratome was used to make a near-clear corneal incision at the 10:00 position in order to avoid intersecting prior scars from radial keratotomy .  A curvilinear capsulorrhexis was made with a cystotome and capsulorrhexis forceps.  Balanced salt solution was used to hydrodissect and hydrodelineate the nucleus.   Phacoemulsification was then used in stop and chop fashion to remove the lens nucleus and epinucleus.  The remaining cortex was then removed using the irrigation and aspiration handpiece. Provisc was then placed into the capsular bag to distend it for  lens placement.  A lens was then injected into the capsular bag.  The remaining viscoelastic was aspirated.   Wounds were hydrated with balanced salt solution.  The anterior chamber was inflated to a physiologic pressure with balanced salt solution.  No wound leaks were noted. Vigamox 0.2 ml of a '1mg'$  per ml solution was injected into the anterior chamber for a dose of 0.2 mg of intracameral antibiotic at the completion of the case.   Timolol and Brimonidine drops were applied to the eye.  The patient was taken to the recovery room in stable condition without complications of anesthesia or surgery.   Mateen Franssen 10/27/2021, 12:40 PM

## 2021-10-27 NOTE — Transfer of Care (Signed)
Immediate Anesthesia Transfer of Care Note  Patient: Kristine Woods  Procedure(s) Performed: CATARACT EXTRACTION PHACO AND INTRAOCULAR LENS PLACEMENT (IOC) RIGHT 10.91 01:15.1 (Right: Eye)  Patient Location: PACU  Anesthesia Type: MAC  Level of Consciousness: awake, alert  and patient cooperative  Airway and Oxygen Therapy: Patient Spontanous Breathing and Patient connected to supplemental oxygen  Post-op Assessment: Post-op Vital signs reviewed, Patient's Cardiovascular Status Stable, Respiratory Function Stable, Patent Airway and No signs of Nausea or vomiting  Post-op Vital Signs: Reviewed and stable  Complications: There were no known notable events for this encounter.

## 2021-10-27 NOTE — Anesthesia Postprocedure Evaluation (Signed)
Anesthesia Post Note  Patient: Kristine Woods  Procedure(s) Performed: CATARACT EXTRACTION PHACO AND INTRAOCULAR LENS PLACEMENT (IOC) RIGHT 10.91 01:15.1 (Right: Eye)  Patient location during evaluation: PACU Anesthesia Type: MAC Level of consciousness: awake and alert Pain management: pain level controlled Vital Signs Assessment: post-procedure vital signs reviewed and stable Respiratory status: spontaneous breathing, nonlabored ventilation and respiratory function stable Cardiovascular status: blood pressure returned to baseline and stable Postop Assessment: no apparent nausea or vomiting Anesthetic complications: no   There were no known notable events for this encounter.   Last Vitals:  Vitals:   10/27/21 1141 10/27/21 1241  BP: (!) 181/93 (!) 159/95  Pulse: 88   Resp:  18  Temp: 36.5 C 36.6 C  SpO2: 97% 95%    Last Pain:  Vitals:   10/27/21 1241  TempSrc:   PainSc: 0-No pain                 [place] Ouch

## 2021-10-27 NOTE — Anesthesia Preprocedure Evaluation (Addendum)
Anesthesia Evaluation  Patient identified by MRN, date of birth, ID band Patient awake    Reviewed: Allergy & Precautions, NPO status , Patient's Chart, lab work & pertinent test results  History of Anesthesia Complications (+) history of anesthetic complications (Ether - As child "died on operating table")  Airway Mallampati: III  TM Distance: >3 FB Neck ROM: full    Dental no notable dental hx.    Pulmonary asthma (exercise and allergy induced) ,    Pulmonary exam normal        Cardiovascular hypertension, Normal cardiovascular exam     Neuro/Psych PSYCHIATRIC DISORDERS Depression negative neurological ROS     GI/Hepatic Neg liver ROS, GERD  ,H/o ESOPHAGEAL DILATION   Endo/Other  Hypothyroidism   Renal/GU      Musculoskeletal  (+) Arthritis ,   Abdominal   Peds  Hematology negative hematology ROS (+)   Anesthesia Other Findings Past Medical History: No date: Arthritis No date: Asthma     Comment:  exercise and allergy induced 06/17/1993: Breast cancer ([place])     Comment:  lt lumpectomy/rad No date: Complication of anesthesia     Comment:  Ether - As child "died on operating table" No date: Depression No date: GERD (gastroesophageal reflux disease) No date: Hypertension No date: Hypothyroidism No date: Personal history of radiation therapy No date: Sciatica of left side     Comment:  Foot numbness No date: Skin cancer     Comment:  Nose  Past Surgical History: No date: ABDOMINAL HYSTERECTOMY No date: ABDOMINOPLASTY 04/13/2015: BREAST BIOPSY; Left     Comment:  intraductal papilloma-usbx/clip 05/05/2015: BREAST BIOPSY; Left     Comment:  NL- benign 1995: BREAST LUMPECTOMY; Left     Comment:  positive 05/05/2015: BREAST LUMPECTOMY WITH NEEDLE LOCALIZATION; Left     Comment:  Procedure: BREAST LUMPECTOMY WITH NEEDLE LOCALIZATION;                Surgeon: Leonie Green, MD;  Location: [facility] ORS;                 Service: General;  Laterality: Left; No date: CHOLECYSTECTOMY No date: COLONOSCOPY W/ POLYPECTOMY No date: DG THUMB LEFT HAND No date: DG THUMB RIGHT HAND ([facility] HX) No date: ESOPHAGEAL DILATION No date: ESOPHAGOGASTRODUODENOSCOPY No date: KNEE ARTHROSCOPY; Left No date: LIPOSUCTION 1995: MASTECTOMY; Left     Comment:  partial  No date: THYROID SURGERY     Comment:  adenoma  BMI    Body Mass Index: 27.98 kg/m      Reproductive/Obstetrics negative OB ROS                            Anesthesia Physical Anesthesia Plan  ASA: 2  Anesthesia Plan: MAC   Post-op Pain Management:    Induction:   PONV Risk Score and Plan:   Airway Management Planned:   Additional Equipment:   Intra-op Plan:   Post-operative Plan:   Informed Consent: I have reviewed the patients History and Physical, chart, labs and discussed the procedure including the risks, benefits and alternatives for the proposed anesthesia with the patient or authorized representative who has indicated his/her understanding and acceptance.       Plan Discussed with: Anesthesiologist, CRNA and Surgeon  Anesthesia Plan Comments:       Anesthesia Quick Evaluation

## 2021-10-28 ENCOUNTER — Encounter: Payer: Self-pay | Admitting: Ophthalmology

## 2021-11-02 ENCOUNTER — Encounter: Payer: Self-pay | Admitting: Ophthalmology

## 2021-11-02 DIAGNOSIS — H2512 Age-related nuclear cataract, left eye: Secondary | ICD-10-CM | POA: Diagnosis not present

## 2021-11-09 NOTE — Discharge Instructions (Signed)

## 2021-11-10 ENCOUNTER — Ambulatory Visit
Admission: RE | Admit: 2021-11-10 | Discharge: 2021-11-10 | Disposition: A | Discharge status: Home / Self Care | Payer: PPO | Attending: Ophthalmology | Admitting: Ophthalmology

## 2021-11-10 ENCOUNTER — Encounter: Payer: Self-pay | Admitting: Ophthalmology

## 2021-11-10 ENCOUNTER — Ambulatory Visit: Payer: PPO | Admitting: General Practice

## 2021-11-10 ENCOUNTER — Encounter
Admission: RE | Disposition: A | Discharge status: Home / Self Care | Payer: Self-pay | Source: Home / Self Care | Attending: Ophthalmology

## 2021-11-10 ENCOUNTER — Other Ambulatory Visit: Payer: Self-pay

## 2021-11-10 DIAGNOSIS — I10 Essential (primary) hypertension: Secondary | ICD-10-CM | POA: Diagnosis not present

## 2021-11-10 DIAGNOSIS — J45909 Unspecified asthma, uncomplicated: Secondary | ICD-10-CM | POA: Insufficient documentation

## 2021-11-10 DIAGNOSIS — Z923 Personal history of irradiation: Secondary | ICD-10-CM | POA: Insufficient documentation

## 2021-11-10 DIAGNOSIS — Z85828 Personal history of other malignant neoplasm of skin: Secondary | ICD-10-CM | POA: Insufficient documentation

## 2021-11-10 DIAGNOSIS — H2512 Age-related nuclear cataract, left eye: Secondary | ICD-10-CM | POA: Diagnosis not present

## 2021-11-10 DIAGNOSIS — F32A Depression, unspecified: Secondary | ICD-10-CM | POA: Insufficient documentation

## 2021-11-10 DIAGNOSIS — K219 Gastro-esophageal reflux disease without esophagitis: Secondary | ICD-10-CM | POA: Insufficient documentation

## 2021-11-10 DIAGNOSIS — Z79899 Other long term (current) drug therapy: Secondary | ICD-10-CM | POA: Diagnosis not present

## 2021-11-10 DIAGNOSIS — E039 Hypothyroidism, unspecified: Secondary | ICD-10-CM | POA: Insufficient documentation

## 2021-11-10 DIAGNOSIS — Z79890 Hormone replacement therapy (postmenopausal): Secondary | ICD-10-CM | POA: Diagnosis not present

## 2021-11-10 DIAGNOSIS — Z853 Personal history of malignant neoplasm of breast: Secondary | ICD-10-CM | POA: Diagnosis not present

## 2021-11-10 HISTORY — PX: CATARACT EXTRACTION W/PHACO: SHX586

## 2021-11-10 SURGERY — PHACOEMULSIFICATION, CATARACT, WITH IOL INSERTION
Anesthesia: Monitor Anesthesia Care | Site: Eye | Laterality: Left

## 2021-11-10 MED ORDER — ARMC OPHTHALMIC DILATING DROPS
1.0000 | OPHTHALMIC | Status: DC | PRN
  Administered 2021-11-10 (×3): 1 via OPHTHALMIC

## 2021-11-10 MED ORDER — FENTANYL CITRATE (PF) 100 MCG/2ML IJ SOLN
INTRAMUSCULAR | Status: DC | PRN
  Administered 2021-11-10 (×2): 50 ug via INTRAVENOUS

## 2021-11-10 MED ORDER — SIGHTPATH DOSE#1 NA HYALUR & NA CHOND-NA HYALUR IO KIT
PACK | INTRAOCULAR | Status: DC | PRN
  Administered 2021-11-10: 1 via OPHTHALMIC

## 2021-11-10 MED ORDER — SIGHTPATH DOSE#1 BSS IO SOLN
INTRAOCULAR | Status: DC | PRN
  Administered 2021-11-10: 1 mL via INTRAMUSCULAR

## 2021-11-10 MED ORDER — NEOMYCIN-POLYMYXIN-DEXAMETH 3.5-10000-0.1 OP OINT
TOPICAL_OINTMENT | OPHTHALMIC | Status: DC | PRN
  Administered 2021-11-10: 1 via OPHTHALMIC

## 2021-11-10 MED ORDER — SIGHTPATH DOSE#1 BSS IO SOLN
INTRAOCULAR | Status: DC | PRN
  Administered 2021-11-10: 15 mL

## 2021-11-10 MED ORDER — LACTATED RINGERS IV SOLN: INTRAVENOUS | Status: DC

## 2021-11-10 MED ORDER — MOXIFLOXACIN HCL 0.5 % OP SOLN
OPHTHALMIC | Status: DC | PRN
  Administered 2021-11-10: 0.2 mL via OPHTHALMIC

## 2021-11-10 MED ORDER — MIDAZOLAM HCL 2 MG/2ML IJ SOLN
INTRAMUSCULAR | Status: DC | PRN
  Administered 2021-11-10 (×2): 1 mg via INTRAVENOUS

## 2021-11-10 MED ORDER — SIGHTPATH DOSE#1 BSS IO SOLN
INTRAOCULAR | Status: DC | PRN
  Administered 2021-11-10: 79 mL via OPHTHALMIC

## 2021-11-10 MED ORDER — BRIMONIDINE TARTRATE-TIMOLOL 0.2-0.5 % OP SOLN
OPHTHALMIC | Status: DC | PRN
  Administered 2021-11-10: 1 [drp] via OPHTHALMIC

## 2021-11-10 MED ORDER — TETRACAINE HCL 0.5 % OP SOLN
1.0000 [drp] | OPHTHALMIC | Status: DC | PRN
  Administered 2021-11-10 (×3): 1 [drp] via OPHTHALMIC

## 2021-11-10 SURGICAL SUPPLY — 10 items
CATARACT SUITE SIGHTPATH (MISCELLANEOUS) ×1 IMPLANT
FEE CATARACT SUITE SIGHTPATH (MISCELLANEOUS) ×1 IMPLANT
GLOVE SRG 8 PF TXTR STRL LF DI (GLOVE) ×1 IMPLANT
GLOVE SURG ENC TEXT LTX SZ7.5 (GLOVE) ×1 IMPLANT
GLOVE SURG UNDER POLY LF SZ8 (GLOVE) ×1
LENS IOL TECNIS EYHANCE 23.0 (Intraocular Lens) IMPLANT
NDL FILTER BLUNT 18X1 1/2 (NEEDLE) ×1 IMPLANT
NEEDLE FILTER BLUNT 18X1 1/2 (NEEDLE) ×1 IMPLANT
SYR 3ML LL SCALE MARK (SYRINGE) ×1 IMPLANT
WATER STERILE IRR 250ML POUR (IV SOLUTION) ×1 IMPLANT

## 2021-11-10 NOTE — Op Note (Signed)
OPERATIVE NOTE  CHARLANE WESTRY [id number] 11/10/2021   PREOPERATIVE DIAGNOSIS:  Nuclear sclerotic cataract left eye. H25.12   POSTOPERATIVE DIAGNOSIS:    Nuclear sclerotic cataract left eye.     PROCEDURE:  Phacoemusification with posterior chamber intraocular lens placement of the left eye  Ultrasound time: Procedure(s): CATARACT EXTRACTION PHACO AND INTRAOCULAR LENS PLACEMENT (IOC) LEFT 12.00 01:30.9 (Left)  LENS:   Implant Name Type Inv. Item Serial No. Manufacturer Lot No. LRB No. Used Action  LENS IOL TECNIS EYHANCE 23.0 - [id number] Intraocular Lens LENS IOL TECNIS EYHANCE 23.0 [id number] [employer]  Left 1 Implanted      SURGEON:  Wyonia Hough, MD   ANESTHESIA:  Topical with tetracaine drops and 2% Xylocaine jelly, augmented with 1% preservative-free intracameral lidocaine.    COMPLICATIONS:  None.   DESCRIPTION OF PROCEDURE:  The patient was identified in the holding room and transported to the operating room and placed in the supine position under the operating microscope.  The left eye was identified as the operative eye and it was prepped and draped in the usual sterile ophthalmic fashion.   A 1 millimeter clear-corneal paracentesis was made at the 1:30 position.  0.5 ml of preservative-free 1% lidocaine was injected into the anterior chamber.  The anterior chamber was filled with Viscoat viscoelastic.  A 2.4 millimeter keratome was used to make a near-clear corneal incision at the 10:30 position.  .  A curvilinear capsulorrhexis was made with a cystotome and capsulorrhexis forceps.  Balanced salt solution was used to hydrodissect and hydrodelineate the nucleus.   Phacoemulsification was then used in stop and chop fashion to remove the lens nucleus and epinucleus.  The remaining cortex was then removed using the irrigation and aspiration handpiece. Provisc was then placed into the capsular bag to distend it for lens placement.  A lens was then injected into the  capsular bag.  The remaining viscoelastic was aspirated.   Wounds were hydrated with balanced salt solution.  The anterior chamber was inflated to a physiologic pressure with balanced salt solution.  No wound leaks were noted. Vigamox 0.2 ml of a '1mg'$  per ml solution was injected into the anterior chamber for a dose of 0.2 mg of intracameral antibiotic at the completion of the case.   Timolol and Brimonidine drops and Maxitrol ointment were applied to the eye.  The patient was taken to the recovery room in stable condition without complications of anesthesia or surgery.  Tobenna Needs 11/10/2021, 11:47 AM

## 2021-11-10 NOTE — H&P (Signed)
[facility]   Primary Care Physician:  Dion Body, MD Ophthalmologist: Dr. Leandrew Koyanagi  Pre-Procedure History & Physical: HPI:  Kristine Woods is a 81 y.o. female here for ophthalmic surgery.   Past Medical History:  Diagnosis Date   Arthritis    Asthma    exercise and allergy induced   Breast cancer ([place]) 06/17/1993   lt lumpectomy/rad   Complication of anesthesia    Ether - As child "died on operating table"   Depression    GERD (gastroesophageal reflux disease)    Hypertension    Hypothyroidism    Personal history of radiation therapy    Sciatica of left side    Foot numbness   Skin cancer    Nose    Past Surgical History:  Procedure Laterality Date   ABDOMINAL HYSTERECTOMY     ABDOMINOPLASTY     BREAST BIOPSY Left 04/13/2015   intraductal papilloma-usbx/clip   BREAST BIOPSY Left 05/05/2015   NL- benign   BREAST LUMPECTOMY Left 1995   positive   BREAST LUMPECTOMY WITH NEEDLE LOCALIZATION Left 05/05/2015   Procedure: BREAST LUMPECTOMY WITH NEEDLE LOCALIZATION;  Surgeon: Leonie Green, MD;  Location: [facility] ORS;  Service: General;  Laterality: Left;   CATARACT EXTRACTION W/PHACO Right 10/27/2021   Procedure: CATARACT EXTRACTION PHACO AND INTRAOCULAR LENS PLACEMENT ([place]) RIGHT 10.91 01:15.1;  Surgeon: Leandrew Koyanagi, MD;  Location: [place];  Service: Ophthalmology;  Laterality: Right;   CHOLECYSTECTOMY     COLONOSCOPY W/ POLYPECTOMY     DG THUMB LEFT HAND     DG THUMB RIGHT HAND ([facility] HX)     ESOPHAGEAL DILATION     ESOPHAGOGASTRODUODENOSCOPY     KNEE ARTHROSCOPY Left    LIPOSUCTION     MASTECTOMY Left 1995   partial    THYROID SURGERY     adenoma    Prior to Admission medications   Medication Sig Start Date End Date Taking? Authorizing Provider  enalapril (VASOTEC) 20 MG tablet Take 20 mg by mouth 2 (two) times daily. 03/01/21  Yes [provider]  gabapentin (NEURONTIN) 300 MG capsule Take 1 qAM, 1  qLunch, 2 qHS 03/29/21  Yes [provider]  hydrochlorothiazide (HYDRODIURIL) 25 MG tablet Take 25 mg by mouth daily. 03/01/21  Yes [provider]  levothyroxine (SYNTHROID) 50 MCG tablet Take 50 mcg by mouth daily. 11/30/20  Yes [provider]  loratadine (CLARITIN) 10 MG tablet Take 10 mg by mouth daily.   Yes [provider]  Multiple Vitamins-Minerals (PRESERVISION AREDS) CAPS Take 2 capsules by mouth daily.   Yes [provider]  omeprazole (PRILOSEC) 20 MG capsule Take 20 mg by mouth daily.   Yes [provider]  traMADol (ULTRAM) 50 MG tablet Take 50 mg by mouth every 6 (six) hours as needed. 04/27/21  Yes [provider]  venlafaxine (EFFEXOR) 75 MG tablet Take 75 mg by mouth 2 (two) times daily.   Yes [provider]  albuterol (PROVENTIL HFA;VENTOLIN HFA) 108 (90 Base) MCG/ACT inhaler Inhale 2 puffs into the lungs every 4 (four) hours as needed for wheezing or shortness of breath.    [provider]  b complex vitamins capsule Take 1 capsule by mouth daily.    [provider]  Calcium Carb-Cholecalciferol 600-10 MG-MCG TABS Take 2 tablets by mouth daily.    [provider]  FIBER PO Take 2 tablets by mouth daily.    [provider]  Glucosamine Sulfate 1000  MG CAPS Take 1 capsule by mouth every morning.    [provider]  ibuprofen (ADVIL,MOTRIN) 200 MG tablet Take 400 mg by mouth 3 (three) times daily.    [provider]  ketoconazole (NIZORAL) 2 % cream Apply to rash under breast QD. 07/26/21   Ralene Bathe, MD  psyllium (KONSYL) 33 % POWD Take by mouth.    [provider]    Allergies as of 09/08/2021 - Review Complete 08/03/2021  Allergen Reaction Noted   Erythromycin Other (See Comments) 04/30/2015   Other  04/30/2015   Penicillins Rash 04/30/2015   Sulfa antibiotics Rash 04/30/2015    Family History  Problem Relation Age of Onset    Breast cancer Neg Hx     Social History   Socioeconomic History   Marital status: Married    Spouse name: Not on file   Number of children: Not on file   Years of education: Not on file   Highest education level: Not on file  Occupational History   Not on file  Tobacco Use   Smoking status: Never   Smokeless tobacco: Never  Vaping Use   Vaping Use: Never used  Substance and Sexual Activity   Alcohol use: Not Currently   Drug use: No   Sexual activity: Yes    Comment: rarely  Other Topics Concern   Not on file  Social History Narrative   Not on file   Social Determinants of Health   Financial Resource Strain: Not on file  Food Insecurity: Not on file  Transportation Needs: Not on file  Physical Activity: Not on file  Stress: Not on file  Social Connections: Not on file  Intimate Partner Violence: Not on file    Review of Systems: See HPI, otherwise negative ROS  Physical Exam: BP (!) 158/89   Pulse 79   Temp (!) 97.5 F (36.4 C) (Temporal)   Resp 18   Ht '5\' 2"'$  (1.575 m)   Wt 68.5 kg   SpO2 96%   BMI 27.62 kg/m  General:   Alert,  pleasant and cooperative in NAD Head:  Normocephalic and atraumatic. Lungs:  Clear to auscultation.    Heart:  Regular rate and rhythm.   Impression/Plan: Kristine Woods is here for ophthalmic surgery.  Risks, benefits, limitations, and alternatives regarding ophthalmic surgery have been reviewed with the patient.  Questions have been answered.  All parties agreeable.   Leandrew Koyanagi, MD  11/10/2021, 10:58 AM

## 2021-11-10 NOTE — Anesthesia Preprocedure Evaluation (Signed)
Anesthesia Evaluation  Patient identified by MRN, date of birth, ID band Patient awake    Reviewed: Allergy & Precautions, NPO status , Patient's Chart, lab work & pertinent test results  History of Anesthesia Complications (+) history of anesthetic complications (Ether - As child "died on operating table")  Airway Mallampati: III  TM Distance: >3 FB Neck ROM: full    Dental no notable dental hx.    Pulmonary asthma (exercise and allergy induced) ,    Pulmonary exam normal        Cardiovascular hypertension, Normal cardiovascular exam     Neuro/Psych PSYCHIATRIC DISORDERS Depression negative neurological ROS     GI/Hepatic Neg liver ROS, GERD  ,H/o ESOPHAGEAL DILATION   Endo/Other  Hypothyroidism   Renal/GU      Musculoskeletal  (+) Arthritis ,   Abdominal   Peds  Hematology negative hematology ROS (+)   Anesthesia Other Findings Past Medical History: No date: Arthritis No date: Asthma     Comment:  exercise and allergy induced 06/17/1993: Breast cancer ([place])     Comment:  lt lumpectomy/rad No date: Complication of anesthesia     Comment:  Ether - As child "died on operating table" No date: Depression No date: GERD (gastroesophageal reflux disease) No date: Hypertension No date: Hypothyroidism No date: Personal history of radiation therapy No date: Sciatica of left side     Comment:  Foot numbness No date: Skin cancer     Comment:  Nose  Past Surgical History: No date: ABDOMINAL HYSTERECTOMY No date: ABDOMINOPLASTY 04/13/2015: BREAST BIOPSY; Left     Comment:  intraductal papilloma-usbx/clip 05/05/2015: BREAST BIOPSY; Left     Comment:  NL- benign 1995: BREAST LUMPECTOMY; Left     Comment:  positive 05/05/2015: BREAST LUMPECTOMY WITH NEEDLE LOCALIZATION; Left     Comment:  Procedure: BREAST LUMPECTOMY WITH NEEDLE LOCALIZATION;                Surgeon: Leonie Green, MD;  Location: [facility] ORS;                 Service: General;  Laterality: Left; No date: CHOLECYSTECTOMY No date: COLONOSCOPY W/ POLYPECTOMY No date: DG THUMB LEFT HAND No date: DG THUMB RIGHT HAND ([facility] HX) No date: ESOPHAGEAL DILATION No date: ESOPHAGOGASTRODUODENOSCOPY No date: KNEE ARTHROSCOPY; Left No date: LIPOSUCTION 1995: MASTECTOMY; Left     Comment:  partial  No date: THYROID SURGERY     Comment:  adenoma  BMI    Body Mass Index: 27.98 kg/m      Reproductive/Obstetrics negative OB ROS                             Anesthesia Physical  Anesthesia Plan  ASA: 2  Anesthesia Plan: MAC   Post-op Pain Management:    Induction:   PONV Risk Score and Plan:   Airway Management Planned:   Additional Equipment:   Intra-op Plan:   Post-operative Plan:   Informed Consent: I have reviewed the patients History and Physical, chart, labs and discussed the procedure including the risks, benefits and alternatives for the proposed anesthesia with the patient or authorized representative who has indicated his/her understanding and acceptance.       Plan Discussed with: Anesthesiologist, CRNA and Surgeon  Anesthesia Plan Comments:         Anesthesia Quick Evaluation

## 2021-11-10 NOTE — Transfer of Care (Signed)
Immediate Anesthesia Transfer of Care Note  Patient: Kristine Woods  Procedure(s) Performed: CATARACT EXTRACTION PHACO AND INTRAOCULAR LENS PLACEMENT (IOC) LEFT 12.00 01:30.9 (Left: Eye)  Patient Location: PACU  Anesthesia Type: MAC  Level of Consciousness: awake, alert  and patient cooperative  Airway and Oxygen Therapy: Patient Spontanous Breathing and Patient connected to supplemental oxygen  Post-op Assessment: Post-op Vital signs reviewed, Patient's Cardiovascular Status Stable, Respiratory Function Stable, Patent Airway and No signs of Nausea or vomiting  Post-op Vital Signs: Reviewed and stable  Complications: No notable events documented.

## 2021-11-10 NOTE — Anesthesia Postprocedure Evaluation (Signed)
Anesthesia Post Note  Patient: Kristine Woods  Procedure(s) Performed: CATARACT EXTRACTION PHACO AND INTRAOCULAR LENS PLACEMENT (IOC) LEFT 12.00 01:30.9 (Left: Eye)  Patient location during evaluation: PACU Anesthesia Type: MAC Level of consciousness: awake and alert Pain management: pain level controlled Vital Signs Assessment: post-procedure vital signs reviewed and stable Respiratory status: spontaneous breathing, nonlabored ventilation, respiratory function stable and patient connected to nasal cannula oxygen Cardiovascular status: stable and blood pressure returned to baseline Postop Assessment: no apparent nausea or vomiting Anesthetic complications: no   No notable events documented.   Last Vitals:  Vitals:   11/10/21 1149 11/10/21 1154  BP: (!) 157/94 (!) 158/88  Pulse: 91 85  Resp: 11 18  Temp: (!) 36.1 C (!) 36.1 C  SpO2: 96% 94%    Last Pain:  Vitals:   11/10/21 1154  TempSrc:   PainSc: 0-No pain                 Martha Clan

## 2021-11-11 ENCOUNTER — Encounter: Payer: Self-pay | Admitting: Ophthalmology

## 2021-12-23 DIAGNOSIS — E039 Hypothyroidism, unspecified: Secondary | ICD-10-CM | POA: Diagnosis not present

## 2021-12-23 DIAGNOSIS — I10 Essential (primary) hypertension: Secondary | ICD-10-CM | POA: Diagnosis not present

## 2021-12-23 DIAGNOSIS — E785 Hyperlipidemia, unspecified: Secondary | ICD-10-CM | POA: Diagnosis not present

## 2021-12-30 DIAGNOSIS — E039 Hypothyroidism, unspecified: Secondary | ICD-10-CM | POA: Diagnosis not present

## 2021-12-30 DIAGNOSIS — E785 Hyperlipidemia, unspecified: Secondary | ICD-10-CM | POA: Diagnosis not present

## 2021-12-30 DIAGNOSIS — I10 Essential (primary) hypertension: Secondary | ICD-10-CM | POA: Diagnosis not present

## 2021-12-30 DIAGNOSIS — F418 Other specified anxiety disorders: Secondary | ICD-10-CM | POA: Diagnosis not present

## 2022-03-28 DIAGNOSIS — M5416 Radiculopathy, lumbar region: Secondary | ICD-10-CM | POA: Diagnosis not present

## 2022-03-28 DIAGNOSIS — M48062 Spinal stenosis, lumbar region with neurogenic claudication: Secondary | ICD-10-CM | POA: Diagnosis not present

## 2022-03-28 DIAGNOSIS — M5136 Other intervertebral disc degeneration, lumbar region: Secondary | ICD-10-CM | POA: Diagnosis not present

## 2022-06-09 DIAGNOSIS — Z9889 Other specified postprocedural states: Secondary | ICD-10-CM | POA: Diagnosis not present

## 2022-06-09 DIAGNOSIS — H43813 Vitreous degeneration, bilateral: Secondary | ICD-10-CM | POA: Diagnosis not present

## 2022-06-09 DIAGNOSIS — H353131 Nonexudative age-related macular degeneration, bilateral, early dry stage: Secondary | ICD-10-CM | POA: Diagnosis not present

## 2022-06-24 DIAGNOSIS — E039 Hypothyroidism, unspecified: Secondary | ICD-10-CM | POA: Diagnosis not present

## 2022-06-24 DIAGNOSIS — I10 Essential (primary) hypertension: Secondary | ICD-10-CM | POA: Diagnosis not present

## 2022-06-24 DIAGNOSIS — E785 Hyperlipidemia, unspecified: Secondary | ICD-10-CM | POA: Diagnosis not present

## 2022-06-30 DIAGNOSIS — I10 Essential (primary) hypertension: Secondary | ICD-10-CM | POA: Diagnosis not present

## 2022-06-30 DIAGNOSIS — E039 Hypothyroidism, unspecified: Secondary | ICD-10-CM | POA: Diagnosis not present

## 2022-06-30 DIAGNOSIS — Z6830 Body mass index (BMI) 30.0-30.9, adult: Secondary | ICD-10-CM | POA: Diagnosis not present

## 2022-06-30 DIAGNOSIS — E6609 Other obesity due to excess calories: Secondary | ICD-10-CM | POA: Diagnosis not present

## 2022-06-30 DIAGNOSIS — E785 Hyperlipidemia, unspecified: Secondary | ICD-10-CM | POA: Diagnosis not present

## 2022-07-19 DIAGNOSIS — M48062 Spinal stenosis, lumbar region with neurogenic claudication: Secondary | ICD-10-CM | POA: Diagnosis not present

## 2022-07-19 DIAGNOSIS — M5416 Radiculopathy, lumbar region: Secondary | ICD-10-CM | POA: Diagnosis not present

## 2022-07-19 DIAGNOSIS — M5134 Other intervertebral disc degeneration, thoracic region: Secondary | ICD-10-CM | POA: Diagnosis not present

## 2022-07-19 DIAGNOSIS — M5136 Other intervertebral disc degeneration, lumbar region: Secondary | ICD-10-CM | POA: Diagnosis not present

## 2022-07-19 DIAGNOSIS — M5030 Other cervical disc degeneration, unspecified cervical region: Secondary | ICD-10-CM | POA: Diagnosis not present

## 2022-07-19 DIAGNOSIS — M545 Low back pain, unspecified: Secondary | ICD-10-CM | POA: Diagnosis not present

## 2022-07-19 DIAGNOSIS — M4316 Spondylolisthesis, lumbar region: Secondary | ICD-10-CM | POA: Diagnosis not present

## 2022-07-19 DIAGNOSIS — M47814 Spondylosis without myelopathy or radiculopathy, thoracic region: Secondary | ICD-10-CM | POA: Diagnosis not present

## 2022-07-19 DIAGNOSIS — M4184 Other forms of scoliosis, thoracic region: Secondary | ICD-10-CM | POA: Diagnosis not present

## 2022-07-19 DIAGNOSIS — M47816 Spondylosis without myelopathy or radiculopathy, lumbar region: Secondary | ICD-10-CM | POA: Diagnosis not present

## 2022-07-27 ENCOUNTER — Ambulatory Visit: Payer: PPO | Admitting: Dermatology

## 2022-07-27 VITALS — BP 138/86 | HR 69

## 2022-07-27 DIAGNOSIS — D1801 Hemangioma of skin and subcutaneous tissue: Secondary | ICD-10-CM | POA: Diagnosis not present

## 2022-07-27 DIAGNOSIS — L821 Other seborrheic keratosis: Secondary | ICD-10-CM

## 2022-07-27 DIAGNOSIS — Z85828 Personal history of other malignant neoplasm of skin: Secondary | ICD-10-CM

## 2022-07-27 DIAGNOSIS — L578 Other skin changes due to chronic exposure to nonionizing radiation: Secondary | ICD-10-CM

## 2022-07-27 DIAGNOSIS — L570 Actinic keratosis: Secondary | ICD-10-CM | POA: Diagnosis not present

## 2022-07-27 DIAGNOSIS — Z1283 Encounter for screening for malignant neoplasm of skin: Secondary | ICD-10-CM

## 2022-07-27 DIAGNOSIS — L814 Other melanin hyperpigmentation: Secondary | ICD-10-CM | POA: Diagnosis not present

## 2022-07-27 DIAGNOSIS — W908XXA Exposure to other nonionizing radiation, initial encounter: Secondary | ICD-10-CM

## 2022-07-27 DIAGNOSIS — L820 Inflamed seborrheic keratosis: Secondary | ICD-10-CM

## 2022-07-27 DIAGNOSIS — D229 Melanocytic nevi, unspecified: Secondary | ICD-10-CM

## 2022-07-27 NOTE — Progress Notes (Signed)
Follow-Up Visit   Subjective  Kristine Woods is a 82 y.o. female who presents for the following: Yearly f/u hx of BCC on her nose. The patient has spots, moles and lesions to be evaluated, some may be new or changing and the patient may have concern these could be cancer.  The patient declines total-body skin exam  The following portions of the chart were reviewed this encounter and updated as appropriate: medications, allergies, medical history  Review of Systems:  No other skin or systemic complaints except as noted in HPI or Assessment and Plan.  Objective  Well appearing patient in no apparent distress; mood and affect are within normal limits.  Skin examined face,arms,hands. Relevant physical exam findings are noted in the Assessment and Plan.  right nose x 1, right infraorbitial x 1 (2) Erythematous thin papules/macules with gritty scale.   left thumb x 1 Stuck-on, waxy, tan-brown papule --Discussed benign etiology and prognosis.    Assessment & Plan   AK (actinic keratosis) (2) right nose x 1, right infraorbitial x 1  Actinic keratoses are precancerous spots that appear secondary to cumulative UV radiation exposure/sun exposure over time. They are chronic with expected duration over 1 year. A portion of actinic keratoses will progress to squamous cell carcinoma of the skin. It is not possible to reliably predict which spots will progress to skin cancer and so treatment is recommended to prevent development of skin cancer.  Recommend daily broad spectrum sunscreen SPF 30+ to sun-exposed areas, reapply every 2 hours as needed.  Recommend staying in the shade or wearing long sleeves, sun glasses (UVA+UVB protection) and wide brim hats (4-inch brim around the entire circumference of the hat). Call for new or changing lesions.   Destruction of lesion - right nose x 1, right infraorbitial x 1 (2) Complexity: simple   Destruction method: cryotherapy   Informed consent:  discussed and consent obtained   Timeout:  patient name, date of birth, surgical site, and procedure verified Lesion destroyed using liquid nitrogen: Yes   Region frozen until ice ball extended beyond lesion: Yes   Outcome: patient tolerated procedure well with no complications   Post-procedure details: wound care instructions given    Inflamed seborrheic keratosis left thumb x 1  Symptomatic, irritating, patient would like treated.   Destruction of lesion - left thumb x 1 Complexity: simple   Destruction method: cryotherapy   Informed consent: discussed and consent obtained   Timeout:  patient name, date of birth, surgical site, and procedure verified Lesion destroyed using liquid nitrogen: Yes   Region frozen until ice ball extended beyond lesion: Yes   Outcome: patient tolerated procedure well with no complications   Post-procedure details: wound care instructions given    Skin cancer screening  Actinic skin damage  History of basal cell carcinoma  Lentigo  Melanocytic nevus, unspecified location  Seborrheic keratosis   Skin cancer screening performed today.  Actinic Damage - Chronic condition, secondary to cumulative UV/sun exposure - diffuse scaly erythematous macules with underlying dyspigmentation - Recommend daily broad spectrum sunscreen SPF 30+ to sun-exposed areas, reapply every 2 hours as needed.  - Staying in the shade or wearing long sleeves, sun glasses (UVA+UVB protection) and wide brim hats (4-inch brim around the entire circumference of the hat) are also recommended for sun protection.  - Call for new or changing lesions.  Lentigines, Seborrheic Keratoses, Hemangiomas - Benign normal skin lesions - Benign-appearing - Call for any changes  Melanocytic Nevi -  Tan-brown and/or pink-flesh-colored symmetric macules and papules - Benign appearing on exam today - Observation - Call clinic for new or changing moles - Recommend daily use of broad spectrum  spf 30+ sunscreen to sun-exposed areas.   HISTORY OF BASAL CELL CARCINOMA OF THE SKIN Nose  - No evidence of recurrence today - Recommend regular full body skin exams - Recommend daily broad spectrum sunscreen SPF 30+ to sun-exposed areas, reapply every 2 hours as needed.  - Call if any new or changing lesions are noted between office visits   Return in about 1 year (around 07/27/2023) for hx of BCC.  IAngelique Holm, CMA, am acting as scribe for Armida Sans, MD .   Documentation: I have reviewed the above documentation for accuracy and completeness, and I agree with the above.  Armida Sans, MD

## 2022-07-27 NOTE — Patient Instructions (Addendum)
Cryotherapy Aftercare  Wash gently with soap and water everyday.   Apply Vaseline and Band-Aid daily until healed.     Due to recent changes in healthcare laws, you may see results of your pathology and/or laboratory studies on MyChart before the doctors have had a chance to review them. We understand that in some cases there may be results that are confusing or concerning to you. Please understand that not all results are received at the same time and often the doctors may need to interpret multiple results in order to provide you with the best plan of care or course of treatment. Therefore, we ask that you please give us 2 business days to thoroughly review all your results before contacting the office for clarification. Should we see a critical lab result, you will be contacted sooner.   If You Need Anything After Your Visit  If you have any questions or concerns for your doctor, please call our main line at [phone] and press option 4 to reach your doctor's medical assistant. If no one answers, please leave a voicemail as directed and we will return your call as soon as possible. Messages left after 4 pm will be answered the following business day.   You may also send us a message via MyChart. We typically respond to MyChart messages within 1-2 business days.  For prescription refills, please ask your pharmacy to contact our office. Our fax number is [phone].  If you have an urgent issue when the clinic is closed that cannot wait until the next business day, you can page your doctor at the number below.    Please note that while we do our best to be available for urgent issues outside of office hours, we are not available 24/7.   If you have an urgent issue and are unable to reach us, you may choose to seek medical care at your doctor's office, retail clinic, urgent care center, or emergency room.  If you have a medical emergency, please immediately call 911 or go to the  emergency department.  Pager Numbers  - Dr. Kowalski: [phone]  - Dr. Moye: [phone]  - Dr. Stewart: [phone]  In the event of inclement weather, please call our main line at [phone] for an update on the status of any delays or closures.  Dermatology Medication Tips: Please keep the boxes that topical medications come in in order to help keep track of the instructions about where and how to use these. Pharmacies typically print the medication instructions only on the boxes and not directly on the medication tubes.   If your medication is too expensive, please contact our office at [phone] option 4 or send us a message through MyChart.   We are unable to tell what your co-pay for medications will be in advance as this is different depending on your insurance coverage. However, we may be able to find a substitute medication at lower cost or fill out paperwork to get insurance to cover a needed medication.   If a prior authorization is required to get your medication covered by your insurance company, please allow us 1-2 business days to complete this process.  Drug prices often vary depending on where the prescription is filled and some pharmacies may offer cheaper prices.  The website www.goodrx.com contains coupons for medications through different pharmacies. The prices here do not account for what the cost may be with help from insurance (it may be cheaper with your insurance), but the website can   give you the price if you did not use any insurance.  - You can print the associated coupon and take it with your prescription to the pharmacy.  - You may also stop by our office during regular business hours and pick up a GoodRx coupon card.  - If you need your prescription sent electronically to a different pharmacy, notify our office through [facility] MyChart or by phone at [phone] option 4.     Si Usted Necesita Algo Despus de Su Visita  Tambin puede  enviarnos un mensaje a travs de MyChart. Por lo general respondemos a los mensajes de MyChart en el transcurso de 1 a 2 das hbiles.  Para renovar recetas, por favor pida a su farmacia que se ponga en contacto con nuestra oficina. Nuestro nmero de fax es el [phone].  Si tiene un asunto urgente cuando la clnica est cerrada y que no puede esperar hasta el siguiente da hbil, puede llamar/localizar a su doctor(a) al nmero que aparece a continuacin.   Por favor, tenga en cuenta que aunque hacemos todo lo posible para estar disponibles para asuntos urgentes fuera del horario de oficina, no estamos disponibles las 24 horas del da, los 7 das de la semana.   Si tiene un problema urgente y no puede comunicarse con nosotros, puede optar por buscar atencin mdica  en el consultorio de su doctor(a), en una clnica privada, en un centro de atencin urgente o en una sala de emergencias.  Si tiene una emergencia mdica, por favor llame inmediatamente al 911 o vaya a la sala de emergencias.  Nmeros de bper  - Dr. Kowalski: [phone]  - Dra. Moye: [phone]  - Dra. Stewart: [phone]  En caso de inclemencias del tiempo, por favor llame a nuestra lnea principal al [phone] para una actualizacin sobre el estado de cualquier retraso o cierre.  Consejos para la medicacin en dermatologa: Por favor, guarde las cajas en las que vienen los medicamentos de uso tpico para ayudarle a seguir las instrucciones sobre dnde y cmo usarlos. Las farmacias generalmente imprimen las instrucciones del medicamento slo en las cajas y no directamente en los tubos del medicamento.   Si su medicamento es muy caro, por favor, pngase en contacto con nuestra oficina llamando al [phone] y presione la opcin 4 o envenos un mensaje a travs de MyChart.   No podemos decirle cul ser su copago por los medicamentos por adelantado ya que esto es diferente dependiendo de la cobertura de su seguro.  Sin embargo, es posible que podamos encontrar un medicamento sustituto a menor costo o llenar un formulario para que el seguro cubra el medicamento que se considera necesario.   Si se requiere una autorizacin previa para que su compaa de seguros cubra su medicamento, por favor permtanos de 1 a 2 das hbiles para completar este proceso.  Los precios de los medicamentos varan con frecuencia dependiendo del lugar de dnde se surte la receta y alguna farmacias pueden ofrecer precios ms baratos.  El sitio web www.goodrx.com tiene cupones para medicamentos de diferentes farmacias. Los precios aqu no tienen en cuenta lo que podra costar con la ayuda del seguro (puede ser ms barato con su seguro), pero el sitio web puede darle el precio si no utiliz ningn seguro.  - Puede imprimir el cupn correspondiente y llevarlo con su receta a la farmacia.  - Tambin puede pasar por nuestra oficina durante el horario de atencin regular y recoger una tarjeta de cupones de GoodRx.  -   Si necesita que su receta se enve electrnicamente a una farmacia diferente, informe a nuestra oficina a travs de MyChart de [place] o por telfono llamando al [phone] y presione la opcin 4.  

## 2022-07-29 ENCOUNTER — Encounter: Payer: Self-pay | Admitting: Dermatology

## 2022-08-09 DIAGNOSIS — M5416 Radiculopathy, lumbar region: Secondary | ICD-10-CM | POA: Diagnosis not present

## 2022-08-09 DIAGNOSIS — M48062 Spinal stenosis, lumbar region with neurogenic claudication: Secondary | ICD-10-CM | POA: Diagnosis not present

## 2022-09-01 DIAGNOSIS — Z79899 Other long term (current) drug therapy: Secondary | ICD-10-CM | POA: Diagnosis not present

## 2022-09-01 DIAGNOSIS — M5136 Other intervertebral disc degeneration, lumbar region: Secondary | ICD-10-CM | POA: Diagnosis not present

## 2022-09-01 DIAGNOSIS — M5416 Radiculopathy, lumbar region: Secondary | ICD-10-CM | POA: Diagnosis not present

## 2022-09-01 DIAGNOSIS — M48062 Spinal stenosis, lumbar region with neurogenic claudication: Secondary | ICD-10-CM | POA: Diagnosis not present

## 2022-12-26 DIAGNOSIS — Z9889 Other specified postprocedural states: Secondary | ICD-10-CM | POA: Diagnosis not present

## 2022-12-26 DIAGNOSIS — H353132 Nonexudative age-related macular degeneration, bilateral, intermediate dry stage: Secondary | ICD-10-CM | POA: Diagnosis not present

## 2022-12-26 DIAGNOSIS — Z961 Presence of intraocular lens: Secondary | ICD-10-CM | POA: Diagnosis not present

## 2023-01-03 DIAGNOSIS — G894 Chronic pain syndrome: Secondary | ICD-10-CM | POA: Diagnosis not present

## 2023-01-03 DIAGNOSIS — N3000 Acute cystitis without hematuria: Secondary | ICD-10-CM | POA: Diagnosis not present

## 2023-01-03 DIAGNOSIS — E039 Hypothyroidism, unspecified: Secondary | ICD-10-CM | POA: Diagnosis not present

## 2023-01-03 DIAGNOSIS — F338 Other recurrent depressive disorders: Secondary | ICD-10-CM | POA: Diagnosis not present

## 2023-01-03 DIAGNOSIS — E782 Mixed hyperlipidemia: Secondary | ICD-10-CM | POA: Diagnosis not present

## 2023-01-03 DIAGNOSIS — I10 Essential (primary) hypertension: Secondary | ICD-10-CM | POA: Diagnosis not present

## 2023-01-03 DIAGNOSIS — Z6830 Body mass index (BMI) 30.0-30.9, adult: Secondary | ICD-10-CM | POA: Diagnosis not present

## 2023-01-03 DIAGNOSIS — Z79891 Long term (current) use of opiate analgesic: Secondary | ICD-10-CM | POA: Diagnosis not present

## 2023-01-03 DIAGNOSIS — K219 Gastro-esophageal reflux disease without esophagitis: Secondary | ICD-10-CM | POA: Diagnosis not present

## 2023-03-06 DIAGNOSIS — M5416 Radiculopathy, lumbar region: Secondary | ICD-10-CM | POA: Diagnosis not present

## 2023-03-06 DIAGNOSIS — M48062 Spinal stenosis, lumbar region with neurogenic claudication: Secondary | ICD-10-CM | POA: Diagnosis not present

## 2023-03-06 DIAGNOSIS — Z79899 Other long term (current) drug therapy: Secondary | ICD-10-CM | POA: Diagnosis not present

## 2023-04-06 DIAGNOSIS — Z79899 Other long term (current) drug therapy: Secondary | ICD-10-CM | POA: Diagnosis not present

## 2023-04-06 DIAGNOSIS — E039 Hypothyroidism, unspecified: Secondary | ICD-10-CM | POA: Diagnosis not present

## 2023-04-06 DIAGNOSIS — R079 Chest pain, unspecified: Secondary | ICD-10-CM | POA: Diagnosis not present

## 2023-04-06 DIAGNOSIS — Z1389 Encounter for screening for other disorder: Secondary | ICD-10-CM | POA: Diagnosis not present

## 2023-04-06 DIAGNOSIS — Z0001 Encounter for general adult medical examination with abnormal findings: Secondary | ICD-10-CM | POA: Diagnosis not present

## 2023-04-06 DIAGNOSIS — I10 Essential (primary) hypertension: Secondary | ICD-10-CM | POA: Diagnosis not present

## 2023-04-06 DIAGNOSIS — Z6830 Body mass index (BMI) 30.0-30.9, adult: Secondary | ICD-10-CM | POA: Diagnosis not present

## 2023-04-06 DIAGNOSIS — E782 Mixed hyperlipidemia: Secondary | ICD-10-CM | POA: Diagnosis not present

## 2023-04-06 DIAGNOSIS — Z1331 Encounter for screening for depression: Secondary | ICD-10-CM | POA: Diagnosis not present

## 2023-04-06 DIAGNOSIS — K219 Gastro-esophageal reflux disease without esophagitis: Secondary | ICD-10-CM | POA: Diagnosis not present

## 2023-07-03 DIAGNOSIS — Z961 Presence of intraocular lens: Secondary | ICD-10-CM | POA: Diagnosis not present

## 2023-07-03 DIAGNOSIS — H26493 Other secondary cataract, bilateral: Secondary | ICD-10-CM | POA: Diagnosis not present

## 2023-07-03 DIAGNOSIS — H353131 Nonexudative age-related macular degeneration, bilateral, early dry stage: Secondary | ICD-10-CM | POA: Diagnosis not present

## 2023-07-13 DIAGNOSIS — M1711 Unilateral primary osteoarthritis, right knee: Secondary | ICD-10-CM | POA: Diagnosis not present

## 2023-07-13 DIAGNOSIS — I10 Essential (primary) hypertension: Secondary | ICD-10-CM | POA: Diagnosis not present

## 2023-07-13 DIAGNOSIS — N3000 Acute cystitis without hematuria: Secondary | ICD-10-CM | POA: Diagnosis not present

## 2023-07-18 DIAGNOSIS — H26492 Other secondary cataract, left eye: Secondary | ICD-10-CM | POA: Diagnosis not present

## 2023-08-08 DIAGNOSIS — E782 Mixed hyperlipidemia: Secondary | ICD-10-CM | POA: Diagnosis not present

## 2023-08-10 DIAGNOSIS — H26491 Other secondary cataract, right eye: Secondary | ICD-10-CM | POA: Diagnosis not present

## 2023-09-06 ENCOUNTER — Ambulatory Visit: Payer: PPO | Admitting: Dermatology

## 2023-09-06 ENCOUNTER — Encounter: Payer: Self-pay | Admitting: Dermatology

## 2023-09-06 DIAGNOSIS — Z1283 Encounter for screening for malignant neoplasm of skin: Secondary | ICD-10-CM | POA: Diagnosis not present

## 2023-09-06 DIAGNOSIS — L814 Other melanin hyperpigmentation: Secondary | ICD-10-CM

## 2023-09-06 DIAGNOSIS — L820 Inflamed seborrheic keratosis: Secondary | ICD-10-CM | POA: Diagnosis not present

## 2023-09-06 DIAGNOSIS — L578 Other skin changes due to chronic exposure to nonionizing radiation: Secondary | ICD-10-CM

## 2023-09-06 DIAGNOSIS — L918 Other hypertrophic disorders of the skin: Secondary | ICD-10-CM

## 2023-09-06 DIAGNOSIS — Z85828 Personal history of other malignant neoplasm of skin: Secondary | ICD-10-CM | POA: Diagnosis not present

## 2023-09-06 DIAGNOSIS — Z7189 Other specified counseling: Secondary | ICD-10-CM | POA: Diagnosis not present

## 2023-09-06 DIAGNOSIS — Z86018 Personal history of other benign neoplasm: Secondary | ICD-10-CM

## 2023-09-06 DIAGNOSIS — I781 Nevus, non-neoplastic: Secondary | ICD-10-CM

## 2023-09-06 DIAGNOSIS — D229 Melanocytic nevi, unspecified: Secondary | ICD-10-CM

## 2023-09-06 DIAGNOSIS — D1801 Hemangioma of skin and subcutaneous tissue: Secondary | ICD-10-CM | POA: Diagnosis not present

## 2023-09-06 DIAGNOSIS — W908XXA Exposure to other nonionizing radiation, initial encounter: Secondary | ICD-10-CM

## 2023-09-06 DIAGNOSIS — L570 Actinic keratosis: Secondary | ICD-10-CM | POA: Diagnosis not present

## 2023-09-06 NOTE — Patient Instructions (Addendum)
 If spot treated at knuckle at right pointer finger do not go away in 2 months give a call or send mychart     For veins at face  Recommend treatment in winter or fall  Counseling for BBL / IPL / Laser and Coordination of Care Discussed the treatment option of Broad Band Light (BBL) [place] Pulsed Light (IPL)/ Laser for skin discoloration, including brown spots and redness.  Typically we recommend at least 1-3 treatment sessions about 5-8 weeks apart for best results.  Cannot have tanned skin when BBL performed, and regular use of sunscreen/photoprotection is advised after the procedure to help maintain results. The patient's condition may also require maintenance treatments in the future.  The fee for BBL / laser treatments is $350 per treatment session for the whole face.  A fee can be quoted for other parts of the body.  Insurance typically does not pay for BBL/laser treatments and therefore the fee is an out-of-pocket cost. Recommend prophylactic valtrex treatment. Once scheduled for procedure, will send Rx in prior to patient's appointment.      Seborrheic Keratosis  What causes seborrheic keratoses? Seborrheic keratoses are harmless, common skin growths that first appear during adult life.  As time goes by, more growths appear.  Some people may develop a large number of them.  Seborrheic keratoses appear on both covered and uncovered body parts.  They are not caused by sunlight.  The tendency to develop seborrheic keratoses can be inherited.  They vary in color from skin-colored to gray, brown, or even black.  They can be either smooth or have a rough, warty surface.   Seborrheic keratoses are superficial and look as if they were stuck on the skin.  Under the microscope this type of keratosis looks like layers upon layers of skin.  That is why at times the top layer may seem to fall off, but the rest of the growth remains and re-grows.    Treatment Seborrheic keratoses do not need to be  treated, but can easily be removed in the office.  Seborrheic keratoses often cause symptoms when they rub on clothing or jewelry.  Lesions can be in the way of shaving.  If they become inflamed, they can cause itching, soreness, or burning.  Removal of a seborrheic keratosis can be accomplished by freezing, burning, or surgery. If any spot bleeds, scabs, or grows rapidly, please return to have it checked, as these can be an indication of a skin cancer.   Actinic keratoses are precancerous spots that appear secondary to cumulative UV radiation exposure/sun exposure over time. They are chronic with expected duration over 1 year. A portion of actinic keratoses will progress to squamous cell carcinoma of the skin. It is not possible to reliably predict which spots will progress to skin cancer and so treatment is recommended to prevent development of skin cancer.  Recommend daily broad spectrum sunscreen SPF 30+ to sun-exposed areas, reapply every 2 hours as needed.  Recommend staying in the shade or wearing long sleeves, sun glasses (UVA+UVB protection) and wide brim hats (4-inch brim around the entire circumference of the hat). Call for new or changing lesions.   Cryotherapy Aftercare  Wash gently with soap and water everyday.   Apply Vaseline and Band-Aid daily until healed.    Melanoma ABCDEs  Melanoma is the most dangerous type of skin cancer, and is the leading cause of death from skin disease.  You are more likely to develop melanoma if you: Have light-colored skin,  light-colored eyes, or red or blond hair Spend a lot of time in the sun Tan regularly, either outdoors or in a tanning bed Have had blistering sunburns, especially during childhood Have a close family member who has had a melanoma Have atypical moles or large birthmarks  Early detection of melanoma is key since treatment is typically straightforward and cure rates are extremely high if we catch it early.   The first sign of  melanoma is often a change in a mole or a new dark spot.  The ABCDE system is a way of remembering the signs of melanoma.  A for asymmetry:  The two halves do not match. B for border:  The edges of the growth are irregular. C for color:  A mixture of colors are present instead of an even brown color. D for diameter:  Melanomas are usually (but not always) greater than 6mm - the size of a pencil eraser. E for evolution:  The spot keeps changing in size, shape, and color.  Please check your skin once per month between visits. You can use a small mirror in front and a large mirror behind you to keep an eye on the back side or your body.   If you see any new or changing lesions before your next follow-up, please call to schedule a visit.  Please continue daily skin protection including broad spectrum sunscreen SPF 30+ to sun-exposed areas, reapplying every 2 hours as needed when you're outdoors.   Staying in the shade or wearing long sleeves, sun glasses (UVA+UVB protection) and wide brim hats (4-inch brim around the entire circumference of the hat) are also recommended for sun protection.       Due to recent changes in healthcare laws, you may see results of your pathology and/or laboratory studies on MyChart before the doctors have had a chance to review them. We understand that in some cases there may be results that are confusing or concerning to you. Please understand that not all results are received at the same time and often the doctors may need to interpret multiple results in order to provide you with the best plan of care or course of treatment. Therefore, we ask that you please give us  2 business days to thoroughly review all your results before contacting the office for clarification. Should we see a critical lab result, you will be contacted sooner.   If You Need Anything After Your Visit  If you have any questions or concerns for your doctor, please call our main line at  [phone] and press option 4 to reach your doctor's medical assistant. If no one answers, please leave a voicemail as directed and we will return your call as soon as possible. Messages left after 4 pm will be answered the following business day.   You may also send us  a message via MyChart. We typically respond to MyChart messages within 1-2 business days.  For prescription refills, please ask your pharmacy to contact our office. Our fax number is [phone].  If you have an urgent issue when the clinic is closed that cannot wait until the next business day, you can page your doctor at the number below.    Please note that while we do our best to be available for urgent issues outside of office hours, we are not available 24/7.   If you have an urgent issue and are unable to reach us , you may choose to seek medical care at your doctor's office, retail clinic,  urgent care center, or emergency room.  If you have a medical emergency, please immediately call 911 or go to the emergency department.  Pager Numbers  - Dr. Hester: [phone]  - Dr. Jackquline: [phone]  - Dr. Claudene: [phone]   - Dr. Raymund: [phone]  In the event of inclement weather, please call our main line at [phone] for an update on the status of any delays or closures.  Dermatology Medication Tips: Please keep the boxes that topical medications come in in order to help keep track of the instructions about where and how to use these. Pharmacies typically print the medication instructions only on the boxes and not directly on the medication tubes.   If your medication is too expensive, please contact our office at [phone] option 4 or send us  a message through MyChart.   We are unable to tell what your co-pay for medications will be in advance as this is different depending on your insurance coverage. However, we may be able to find a substitute medication at lower cost or fill out paperwork to  get insurance to cover a needed medication.   If a prior authorization is required to get your medication covered by your insurance company, please allow us  1-2 business days to complete this process.  Drug prices often vary depending on where the prescription is filled and some pharmacies may offer cheaper prices.  The website www.goodrx.com contains coupons for medications through different pharmacies. The prices here do not account for what the cost may be with help from insurance (it may be cheaper with your insurance), but the website can give you the price if you did not use any insurance.  - You can print the associated coupon and take it with your prescription to the pharmacy.  - You may also stop by our office during regular business hours and pick up a GoodRx coupon card.  - If you need your prescription sent electronically to a different pharmacy, notify our office through [facility] or by phone at [phone] option 4.     Si Usted Necesita Algo Despus de Su Visita  Tambin puede enviarnos un mensaje a travs de Clinical cytogeneticist. Por lo general respondemos a los mensajes de MyChart en el transcurso de 1 a 2 das hbiles.  Para renovar recetas, por favor pida a su farmacia que se ponga en contacto con nuestra oficina. Randi lakes de fax es [place] [phone].  Si tiene un asunto urgente cuando la clnica est cerrada y que no puede esperar hasta el siguiente da hbil, puede llamar/localizar a su doctor(a) al nmero que aparece a continuacin.   Por favor, tenga en cuenta que aunque hacemos todo lo posible para estar disponibles para asuntos urgentes fuera del horario de [place], no estamos disponibles las 24 horas del da, los 7 [address]  [address] de la [place].   Si tiene un problema urgente y no puede comunicarse con nosotros, puede optar por buscar atencin mdica  en el consultorio de su doctor(a), en una clnica privada, en un centro de atencin urgente o en una sala de emergencias.  Si  tiene Engineer, drilling, por favor llame inmediatamente al 911 o vaya a la sala de emergencias.  Nmeros de bper  - Dr. Hester: [phone]  - Dra. Jackquline: [phone]  - Dr. Claudene: [phone]  - Dra. Kitts: [phone]  En caso de inclemencias del [place], por favor llame a nuestra lnea principal al [phone] para una actualizacin sobre el estado de cualquier retraso o cierre.  Consejos para la medicacin en dermatologa: Por favor, guarde las cajas en las que vienen los medicamentos de uso tpico para ayudarle a seguir las instrucciones sobre dnde y cmo usarlos. Las farmacias generalmente imprimen las instrucciones del medicamento slo en las cajas y no directamente en los tubos del [place].   Si su medicamento es muy caro, por favor, pngase en contacto con landry rieger llamando al [phone] y presione la opcin 4 o envenos un mensaje a travs de Clinical cytogeneticist.   No podemos decirle cul ser su copago por los medicamentos por adelantado ya que esto es diferente dependiendo de la cobertura de su seguro. Sin embargo, es posible que podamos encontrar un medicamento sustituto a Audiological scientist un formulario para que el seguro cubra el medicamento que se considera necesario.   Si se requiere una autorizacin previa para que su compaa de seguros malta su medicamento, por favor permtanos de 1 a 2 das hbiles para completar este proceso.  Los precios de los medicamentos varan con frecuencia dependiendo del Environmental consultant de dnde se surte la receta y alguna farmacias pueden ofrecer precios ms baratos.  El sitio web www.goodrx.com tiene cupones para medicamentos de Health and safety inspector. Los precios aqu no tienen en cuenta lo que podra costar con la ayuda del seguro (puede ser ms barato con su seguro), pero el sitio web puede darle el precio si no utiliz Tourist information centre manager.  - Puede imprimir el cupn correspondiente y llevarlo con su receta a la farmacia.  - Tambin puede  pasar por nuestra oficina durante el horario de atencin regular y Education officer, museum una tarjeta de cupones de GoodRx.  - Si necesita que su receta se enve electrnicamente a una farmacia diferente, informe a nuestra oficina a travs de MyChart de [place] o por telfono llamando al [phone] y presione la opcin 4.

## 2023-09-06 NOTE — Progress Notes (Signed)
 Follow-Up Visit   Subjective  Kristine Woods is a 83 y.o. female who presents for the following: Skin Cancer Screening and Full Body Skin Exam Hx of bcc, hx of aks, hx of isks The patient presents for Total-Body Skin Exam (TBSE) for skin cancer screening and mole check. The patient has spots, moles and lesions to be evaluated, some may be new or changing and the patient may have concern these could be cancer.  The following portions of the chart were reviewed this encounter and updated as appropriate: medications, allergies, medical history  Review of Systems:  No other skin or systemic complaints except as noted in HPI or Assessment and Plan.  Objective  Well appearing patient in no apparent distress; mood and affect are within normal limits.  A full examination was performed including scalp, head, eyes, ears, nose, lips, neck, chest, axillae, abdomen, back, buttocks, bilateral upper extremities, bilateral lower extremities, hands, feet, fingers, toes, fingernails, and toenails. All findings within normal limits unless otherwise noted below.   Relevant physical exam findings are noted in the Assessment and Plan.  Right 2nd Finger Metacarpophalangeal Joint x 1, left cheek x 2 (2) Erythematous thin papules/macules with gritty scale.  left cheek x 5, right cheek x 3, right inframamary x 15 (23) Erythematous stuck-on, waxy papule or plaque b/l axillary Fleshy, skin-colored pedunculated papules at axillary   Assessment & Plan   SKIN CANCER SCREENING PERFORMED TODAY.  ACTINIC DAMAGE - Chronic condition, secondary to cumulative UV/sun exposure - diffuse scaly erythematous macules with underlying dyspigmentation - Recommend daily broad spectrum sunscreen SPF 30+ to sun-exposed areas, reapply every 2 hours as needed.  - Staying in the shade or wearing long sleeves, sun glasses (UVA+UVB protection) and wide brim hats (4-inch brim around the entire circumference of the hat) are also  recommended for sun protection.  - Call for new or changing lesions.  LENTIGINES, SEBORRHEIC KERATOSES, HEMANGIOMAS - Benign normal skin lesions - Benign-appearing - Call for any changes  TELANGIECTASIA Exam: dilated blood vessel at face/ chin Treatment Plan: Benign appearing on exam Call for changes Discussed  Counseling for BBL / IPL / Laser and Coordination of Care Discussed the treatment option of Broad Band Light (BBL) /Intense Pulsed Light (IPL)/ Laser for skin discoloration, including brown spots and redness.  Typically we recommend at least 1-3 treatment sessions about 5-8 weeks apart for best results.  Cannot have tanned skin when BBL performed, and regular use of sunscreen/photoprotection is advised after the procedure to help maintain results. The patient's condition may also require maintenance treatments in the future.  The fee for BBL / laser treatments is $350 per treatment session for the whole face.  A fee can be quoted for other parts of the body.  Insurance typically does not pay for BBL/laser treatments and therefore the fee is an out-of-pocket cost. Recommend prophylactic valtrex treatment. Once scheduled for procedure, will send Rx in prior to patient's appointment.   MELANOCYTIC NEVI - Tan-brown and/or pink-flesh-colored symmetric macules and papules - Benign appearing on exam today - Observation - Call clinic for new or changing moles - Recommend daily use of broad spectrum spf 30+ sunscreen to sun-exposed areas.   Acrochordons (Skin Tags) B/l axillary  - Fleshy, skin-colored pedunculated papules - Benign appearing.  - Observe. - If desired, they can be removed with an in office procedure that may not be covered by insurance. - Please call the clinic if you notice any new or changing lesions.  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  - Call if any new or changing lesions are noted between office visits   History of fibrous papule at right lateral nose supratip See previous pathology dated 09/07/2009 Scanned into media   HISTORY OF breast cancer at left breast  - radiation tatoo  - Clear. Observe for recurrence.  - Call clinic for new or changing lesions.   - Recommend regular skin exams, daily broad-spectrum spf 30+ sunscreen use, and photoprotection.     ACTINIC KERATOSIS (3) Right 2nd Finger Metacarpophalangeal Joint x 1, left cheek x 2 (2) Discussed if spot at right 2nd finger metacarpophalangeal joint is not resolved in 2 Actinic keratoses are precancerous spots that appear secondary to cumulative UV radiation exposure/sun exposure over time. They are chronic with expected duration over 1 year. A portion of actinic keratoses will progress to squamous cell carcinoma of the skin. It is not possible to reliably predict which spots will progress to skin cancer and so treatment is recommended to prevent development of skin cancer.  Recommend daily broad spectrum sunscreen SPF 30+ to sun-exposed areas, reapply every 2 hours as needed.  Recommend staying in the shade or wearing long sleeves, sun glasses (UVA+UVB protection) and wide brim hats (4-inch brim around the entire circumference of the hat). Call for new or changing lesions. Destruction of lesion - Right 2nd Finger Metacarpophalangeal Joint x 1, left cheek x 2 (2) Complexity: simple   Destruction method: cryotherapy   Informed consent: discussed and consent obtained   Timeout:  patient name, date of birth, surgical site, and procedure verified Lesion destroyed using liquid nitrogen: Yes   Region frozen until ice ball extended beyond lesion: Yes   Outcome: patient tolerated  procedure well with no complications   Post-procedure details: wound care instructions given    INFLAMED SEBORRHEIC KERATOSIS (23) left cheek x 5, right cheek x 3, right inframamary x 15 (23) Symptomatic, irritating, patient would like treated. Destruction of lesion - left cheek x 5, right cheek x 3, right inframamary x 15 (23) Complexity: simple   Destruction method: cryotherapy   Informed consent: discussed and consent obtained   Timeout:  patient name, date of birth, surgical site, and procedure verified Lesion destroyed using liquid nitrogen: Yes   Region frozen until ice ball extended beyond lesion: Yes   Outcome: patient tolerated procedure well with no complications   Post-procedure details: wound care instructions given    SKIN TAG b/l axillary Traumatized from shaving  Discussed can be treated with cryotherapy Patient deferred treatment today  Will call and have treated at another time.    Return in about 1 year (around 09/05/2024) for TBSE.  IEleanor Blush, CMA, am acting as scribe for Alm Rhyme, MD.   Documentation: I have reviewed the above documentation for accuracy and completeness, and I agree with the above.  Alm Rhyme, MD

## 2023-09-12 DIAGNOSIS — M5416 Radiculopathy, lumbar region: Secondary | ICD-10-CM | POA: Diagnosis not present

## 2023-09-12 DIAGNOSIS — Z79899 Other long term (current) drug therapy: Secondary | ICD-10-CM | POA: Diagnosis not present

## 2023-09-12 DIAGNOSIS — M48062 Spinal stenosis, lumbar region with neurogenic claudication: Secondary | ICD-10-CM | POA: Diagnosis not present

## 2024-01-24 ENCOUNTER — Ambulatory Visit: Admitting: Dermatology

## 2024-10-02 ENCOUNTER — Ambulatory Visit: Admitting: Dermatology
